# Patient Record
Sex: Female | Born: 1960
Health system: Southern US, Community
[De-identification: ages and names within clinical notes are randomized; demographics above are authoritative.]

## PROBLEM LIST (undated history)

## (undated) DIAGNOSIS — J45909 Unspecified asthma, uncomplicated: Secondary | ICD-10-CM

## (undated) DIAGNOSIS — R51 Headache: Secondary | ICD-10-CM

## (undated) DIAGNOSIS — R7611 Nonspecific reaction to tuberculin skin test without active tuberculosis: Secondary | ICD-10-CM

## (undated) DIAGNOSIS — F419 Anxiety disorder, unspecified: Secondary | ICD-10-CM

## (undated) DIAGNOSIS — R011 Cardiac murmur, unspecified: Secondary | ICD-10-CM

## (undated) DIAGNOSIS — K589 Irritable bowel syndrome without diarrhea: Secondary | ICD-10-CM

## (undated) DIAGNOSIS — I10 Essential (primary) hypertension: Secondary | ICD-10-CM

## (undated) DIAGNOSIS — F32A Depression, unspecified: Secondary | ICD-10-CM

## (undated) DIAGNOSIS — J302 Other seasonal allergic rhinitis: Secondary | ICD-10-CM

## (undated) DIAGNOSIS — K219 Gastro-esophageal reflux disease without esophagitis: Secondary | ICD-10-CM

## (undated) DIAGNOSIS — F329 Major depressive disorder, single episode, unspecified: Secondary | ICD-10-CM

## (undated) DIAGNOSIS — M5030 Other cervical disc degeneration, unspecified cervical region: Secondary | ICD-10-CM

## (undated) DIAGNOSIS — M797 Fibromyalgia: Secondary | ICD-10-CM

## (undated) DIAGNOSIS — M810 Age-related osteoporosis without current pathological fracture: Secondary | ICD-10-CM

## (undated) DIAGNOSIS — G43909 Migraine, unspecified, not intractable, without status migrainosus: Secondary | ICD-10-CM

## (undated) DIAGNOSIS — K449 Diaphragmatic hernia without obstruction or gangrene: Secondary | ICD-10-CM

## (undated) HISTORY — DX: Other seasonal allergic rhinitis: J30.2

## (undated) HISTORY — PX: UPPER GASTROINTESTINAL ENDOSCOPY: SHX188

## (undated) HISTORY — DX: Age-related osteoporosis without current pathological fracture: M81.0

## (undated) HISTORY — DX: Diaphragmatic hernia without obstruction or gangrene: K44.9

## (undated) HISTORY — DX: Essential (primary) hypertension: I10

## (undated) HISTORY — DX: Depression, unspecified: F32.A

## (undated) HISTORY — DX: Major depressive disorder, single episode, unspecified: F32.9

## (undated) HISTORY — DX: Other cervical disc degeneration, unspecified cervical region: M50.30

## (undated) HISTORY — PX: CHOLECYSTECTOMY: SHX55

## (undated) HISTORY — DX: Nonspecific reaction to tuberculin skin test without active tuberculosis: R76.11

## (undated) HISTORY — DX: Anxiety disorder, unspecified: F41.9

## (undated) HISTORY — DX: Unspecified asthma, uncomplicated: J45.909

## (undated) HISTORY — DX: Migraine, unspecified, not intractable, without status migrainosus: G43.909

## (undated) HISTORY — DX: Gastro-esophageal reflux disease without esophagitis: K21.9

## (undated) HISTORY — DX: Fibromyalgia: M79.7

## (undated) HISTORY — PX: COLONOSCOPY: SHX174

## (undated) HISTORY — DX: Cardiac murmur, unspecified: R01.1

## (undated) HISTORY — DX: Headache: R51

## (undated) HISTORY — DX: Irritable bowel syndrome, unspecified: K58.9

---

## 1997-10-11 ENCOUNTER — Emergency Department (HOSPITAL_COMMUNITY): Admission: EM | Admit: 1997-10-11 | Discharge: 1997-10-11 | Payer: Self-pay | Admitting: Emergency Medicine

## 1999-02-12 DIAGNOSIS — R7611 Nonspecific reaction to tuberculin skin test without active tuberculosis: Secondary | ICD-10-CM

## 1999-02-12 HISTORY — DX: Nonspecific reaction to tuberculin skin test without active tuberculosis: R76.11

## 2003-09-19 ENCOUNTER — Inpatient Hospital Stay (HOSPITAL_COMMUNITY): Admission: RE | Admit: 2003-09-19 | Discharge: 2003-09-27 | Payer: Self-pay | Admitting: Psychiatry

## 2003-09-28 ENCOUNTER — Other Ambulatory Visit (HOSPITAL_COMMUNITY): Admission: RE | Admit: 2003-09-28 | Discharge: 2003-12-27 | Payer: Self-pay | Admitting: Psychiatry

## 2004-06-25 ENCOUNTER — Encounter: Admission: RE | Admit: 2004-06-25 | Discharge: 2004-06-25 | Payer: Self-pay | Admitting: Family Medicine

## 2004-07-11 ENCOUNTER — Other Ambulatory Visit: Admission: RE | Admit: 2004-07-11 | Discharge: 2004-07-11 | Payer: Self-pay | Admitting: Family Medicine

## 2006-05-06 ENCOUNTER — Encounter: Admission: RE | Admit: 2006-05-06 | Discharge: 2006-05-06 | Payer: Self-pay | Admitting: Family Medicine

## 2006-06-18 ENCOUNTER — Inpatient Hospital Stay (HOSPITAL_COMMUNITY): Admission: AD | Admit: 2006-06-18 | Discharge: 2006-06-23 | Payer: Self-pay | Admitting: Psychiatry

## 2006-06-18 ENCOUNTER — Ambulatory Visit: Payer: Self-pay | Admitting: Psychiatry

## 2006-06-24 ENCOUNTER — Other Ambulatory Visit (HOSPITAL_COMMUNITY): Admission: RE | Admit: 2006-06-24 | Discharge: 2006-07-11 | Payer: Self-pay | Admitting: Psychiatry

## 2008-10-01 ENCOUNTER — Inpatient Hospital Stay (HOSPITAL_COMMUNITY): Admission: EM | Admit: 2008-10-01 | Discharge: 2008-10-03 | Payer: Self-pay | Admitting: Family Medicine

## 2008-10-02 ENCOUNTER — Encounter (INDEPENDENT_AMBULATORY_CARE_PROVIDER_SITE_OTHER): Payer: Self-pay | Admitting: General Surgery

## 2009-08-07 ENCOUNTER — Ambulatory Visit: Payer: Self-pay | Admitting: Internal Medicine

## 2009-08-07 ENCOUNTER — Other Ambulatory Visit: Admission: RE | Admit: 2009-08-07 | Discharge: 2009-08-07 | Payer: Self-pay | Admitting: Internal Medicine

## 2009-10-09 ENCOUNTER — Ambulatory Visit: Payer: Self-pay | Admitting: Internal Medicine

## 2010-02-15 ENCOUNTER — Ambulatory Visit
Admission: RE | Admit: 2010-02-15 | Discharge: 2010-02-15 | Payer: Self-pay | Source: Home / Self Care | Attending: Internal Medicine | Admitting: Internal Medicine

## 2010-05-19 LAB — CBC
HCT: 36.5 % (ref 36.0–46.0)
Hemoglobin: 12.4 g/dL (ref 12.0–15.0)
MCHC: 33.6 g/dL (ref 30.0–36.0)
MCHC: 33.9 g/dL (ref 30.0–36.0)
MCV: 92.2 fL (ref 78.0–100.0)
Platelets: 180 10*3/uL (ref 150–400)
Platelets: 202 10*3/uL (ref 150–400)
RBC: 3.96 MIL/uL (ref 3.87–5.11)
RDW: 14.3 % (ref 11.5–15.5)
RDW: 15 % (ref 11.5–15.5)
WBC: 6.8 K/uL (ref 4.0–10.5)

## 2010-05-19 LAB — DIFFERENTIAL
Basophils Absolute: 0 K/uL (ref 0.0–0.1)
Basophils Relative: 0 % (ref 0–1)
Eosinophils Absolute: 0.1 K/uL (ref 0.0–0.7)
Eosinophils Relative: 1 % (ref 0–5)
Lymphocytes Relative: 15 % (ref 12–46)
Lymphs Abs: 1 10*3/uL (ref 0.7–4.0)
Monocytes Absolute: 0.6 10*3/uL (ref 0.1–1.0)
Monocytes Relative: 9 % (ref 3–12)
Neutro Abs: 5.1 K/uL (ref 1.7–7.7)
Neutrophils Relative %: 75 % (ref 43–77)

## 2010-05-19 LAB — COMPREHENSIVE METABOLIC PANEL
ALT: 255 U/L — ABNORMAL HIGH (ref 0–35)
AST: 271 U/L — ABNORMAL HIGH (ref 0–37)
Albumin: 2.8 g/dL — ABNORMAL LOW (ref 3.5–5.2)
Albumin: 3.4 g/dL — ABNORMAL LOW (ref 3.5–5.2)
Alkaline Phosphatase: 233 U/L — ABNORMAL HIGH (ref 39–117)
BUN: 3 mg/dL — ABNORMAL LOW (ref 6–23)
BUN: 8 mg/dL (ref 6–23)
Calcium: 7.6 mg/dL — ABNORMAL LOW (ref 8.4–10.5)
Calcium: 8.7 mg/dL (ref 8.4–10.5)
Chloride: 103 mEq/L (ref 96–112)
Chloride: 104 mEq/L (ref 96–112)
Creatinine, Ser: 0.71 mg/dL (ref 0.4–1.2)
Creatinine, Ser: 0.78 mg/dL (ref 0.4–1.2)
GFR calc Af Amer: >60 mL/min (ref >60)
GFR calc non Af Amer: >60 mL/min (ref >60)
Potassium: 3.1 mEq/L — ABNORMAL LOW (ref 3.5–5.1)
Potassium: 3.4 mEq/L — ABNORMAL LOW (ref 3.5–5.1)
Sodium: 138 mEq/L (ref 135–145)
Total Protein: 6.5 g/dL (ref 6.0–8.3)

## 2010-05-19 LAB — URINALYSIS, ROUTINE W REFLEX MICROSCOPIC
Glucose, UA: NEGATIVE mg/dL
Hgb urine dipstick: NEGATIVE
Nitrite: NEGATIVE
Protein, ur: NEGATIVE mg/dL
Specific Gravity, Urine: 1.023 (ref 1.005–1.030)
Urobilinogen, UA: 4 mg/dL — ABNORMAL HIGH (ref 0.0–1.0)
pH: 6 (ref 5.0–8.0)

## 2010-05-19 LAB — HEPATIC FUNCTION PANEL
AST: 100 U/L — ABNORMAL HIGH (ref 0–37)
Albumin: 2.6 g/dL — ABNORMAL LOW (ref 3.5–5.2)
Alkaline Phosphatase: 208 U/L — ABNORMAL HIGH (ref 39–117)
Bilirubin, Direct: 0.2 mg/dL (ref 0.0–0.3)
Indirect Bilirubin: 0.5 mg/dL (ref 0.3–0.9)

## 2010-05-19 LAB — PREGNANCY, URINE: Preg Test, Ur: NEGATIVE

## 2010-05-19 LAB — COMPREHENSIVE METABOLIC PANEL WITH GFR
AST: 477 U/L — ABNORMAL HIGH (ref 0–37)
Alkaline Phosphatase: 257 U/L — ABNORMAL HIGH (ref 39–117)
CO2: 28 meq/L (ref 19–32)
GFR calc non Af Amer: >60 mL/min (ref >60)
Glucose, Bld: 101 mg/dL — ABNORMAL HIGH (ref 70–99)
Total Bilirubin: 1.3 mg/dL — ABNORMAL HIGH (ref 0.3–1.2)

## 2010-05-19 LAB — PROTIME-INR
INR: 0.9 (ref 0.00–1.49)
Prothrombin Time: 12.5 seconds (ref 11.6–15.2)

## 2010-05-19 LAB — TYPE AND SCREEN: Antibody Screen: NEGATIVE

## 2010-05-19 LAB — HEPATITIS B SURFACE ANTIGEN: Hepatitis B Surface Ag: NEGATIVE

## 2010-06-26 NOTE — Op Note (Signed)
[name], [name]                ACCOUNT NO.:  [id number]   MEDICAL RECORD NO.:  [id number]          PATIENT TYPE:  INP   LOCATION:  1531                         FACILITY:  [facility]   PHYSICIAN:  Petra Kuba, M.D.    DATE OF BIRTH:  01/16/1961   DATE OF PROCEDURE:  10/01/2008  DATE OF DISCHARGE:                               OPERATIVE REPORT   PROCEDURE:  Endoscopic retrograde cholangiopancreatography,  sphincterotomy and stone extraction.   INDICATIONS:  Probable CBD stone in  a patient with gallstones, dilated  CBD, nausea, vomiting, abdominal pain, elevated liver tests.  Consent  was signed after risks, benefits, methods, options thoroughly discussed  by my partner, Dr. Matthias Hughs and me prior to procedure.   MEDICINES USED DURING THE PROCEDURE:  Fentanyl 50 mcg, Versed 2.5 mg,  glucagon for increased spasm throughout the procedure 2 mg.  She did  have pre procedure Ativan  2 mg and Phenergan 25 mg prior to the  procedure as well.   PROCEDURE:  The video therapeutic duodenoscope was inserted by indirect  vision into the stomach and advanced through her antrum and pylorus into  the duodenum and a normal-appearing ampulla was brought into view.  Using the triple-lumen sphincterotome deep selective cannulation was  obtained and the Jag wire was advanced into the intrahepatic.  No PD  injections or wire advancements were done.   On initial cholangiogram a small stone was seen.  The cystic duct was  patent.  There was some cystic duct filling.  We went ahead and  proceeded with a medium-sized sphincterotomy until we had adequate  biliary drainage and were able to get the fully bowed sphincterotome  easily in and out of the duct.  Unfortunately at that juncture using the  pre-spasm throughout the procedure  and we had to give a moderate amount  of glucagon just to finish the procedure.  We did not increase the  sphincterotomy any further once the spasm started.   We did advance the  balloon adjustable 12-15 mm over the wire after the  sphincterotome was removed and proceeded with three or four balloon pull-  throughs.  Unfortunately on the first balloon pull-through both the wire  and the balloon fell out of the duct and it was not clearly apparent  that the stone was delivered at that juncture due to spasm.  Once the  spasm decreased,  we were able to recannulate with the balloon catheter  and again the wire was advanced into the intrahepatic.   We went ahead and proceeded with a few more balloon pull-throughs  at  the 12 mm mark which passed fairly easily through the ampulla requiring  just downward deflection of the scope to withdrawal.  We then went ahead  and proceeded with an occlusion cholangiogram which was normal.  No  further stones were seen or delivered although at this juncture we did  see one in the duodenum which was small.  We went ahead and pulled the  balloon through one more time and again no abnormalities or obvious  residual stones were seen.  We elected to stop the procedure at this  junction.  The wire was removed as was the balloon catheter. Scope was  withdrawn.  The patient tolerated the procedure well.  There was no  obvious immediate complication.   ENDOSCOPIC DIAGNOSES:  1. Normal ampulla.  2. No PD injections or wire advancements.  3. One small common bile duct stone seen on initial injection status      post medium sphincterotomy and multiple 12-mm balloon pull-      throughs using the adjustable 12-15 mm balloon.  4. Negative occlusion cholangiogram at the end of the procedure.  5. Negative balloon pull-through at the end of the procedure was then      seen in the duodenum.   PLAN:  Will observe for delayed complications.  If none, laparoscopic  cholecystectomy hopefully tomorrow.  Will notify Dr. Johna Sheriff.           ______________________________  Petra Kuba, M.D.     MEM/MEDQ  D:  10/01/2008  T:  10/02/2008  Job:   [id number]   cc:   Marlane Mingle, M.D.  Fax: [phone]

## 2010-06-26 NOTE — Consult Note (Signed)
[name], [name]                ACCOUNT NO.:  [id number]   MEDICAL RECORD NO.:  [id number]          PATIENT TYPE:  INP   LOCATION:  1531                         FACILITY:  [facility]   PHYSICIAN:  Sharlet Salina T. Hoxworth, M.D.DATE OF BIRTH:  [birth year]   DATE OF CONSULTATION:  10/01/2008  DATE OF DISCHARGE:                                 CONSULTATION   CHIEF COMPLAINT:  Abdominal pain.   HISTORY:  The patient is a 50 year old female who presented to the  emergency room today with the acute onset of severe pressure-like upper  abdominal pain radiating to both costal margins associated with nausea  and no vomiting.  The patient has been having intermittent episodes of  pain gradually worsening actually for a couple of years.  She has been  previously diagnosed with a hiatal hernia.  Upon presentation to the  emergency room she was found to have evaluated LFTs and small gallstones  as detailed below.  Earlier today she underwent a successful ERCP and  stone extraction by Dr. Ewing Schlein.  She currently denies pain or nausea and  is doing well following this procedure.  She has had no fever, chills,  jaundice noted.   PAST MEDICAL HISTORY:  Previous surgeries include cesarean section times  two.  Medically, she is followed for depression and fibromyalgia.   MEDICATIONS:  Medications on admission are Cymbalta and Seroquel.   ALLERGIES:  No known drug allergies.   SOCIAL HISTORY:  She works as a Patent examiner.  She drinks one or  two drinks a day.  She does not smoke cigarettes.   FAMILY HISTORY:  Family history is noncontributory.   REVIEW OF SYSTEMS:  GENERAL:  Weight stable.  No fever or chills.  RESPIRATORY:  She states she has occasional wheezing and asthma for  which she uses an inhaler infrequently.  CARDIAC:  No history of cardiac disease, chest pain, palpitations,  swelling.  ABDOMEN/GASTROINTESTINAL:  As above.  GU:  No urinary burning, frequency, hematuria.  MUSCULOSKELETAL:   Positive for chronic joint pain.  PSYCHIATRIC:  Positive for history of depression, not active.   PHYSICAL EXAMINATION:  VITALS SIGNS:  She is afebrile.  Respirations are  18, heart rate 100, blood pressure 116/72.  GENERAL:  In general she is slightly drowsy following ERCP but  responsive in no acute distress.  SKIN:  Warm and dry.  No rash or infection.  HEENT:  No definite icterus.  Pupils are equal, round, and reactive.  Oropharynx is clear.  No masses.  LYMPH NODES:  No cervical, supraclavicular nodes palpable.  LUNGS:  Clear without wheezing or increased work of breathing.  CARDIAC:  Regular rate and rhythm.  No murmurs.  No edema.  ABDOMEN:  Soft and nontender.  No discernible masses or organomegaly.  EXTREMITIES:  No swelling, deformity.  NEUROLOGIC:  Alert and oriented.  Motor and sensory examination grossly  normal.   LABORATORY:  White count is 6.8, hemoglobin 12.4.  Electrolytes abnormal  only for a potassium of 3.4.  INR is 0.9.  LFTs prior to ERCP were a  bilirubin of 1.3,  alkaline phosphatase 257, transaminases 477 and 255  respectively.  Lipase was 21.  Urinalysis negative.  Hepatitis surface  antigen negative.  Pregnancy test negative.   IMAGING:  CT scan of the abdomen obtained on admission showed  cholelithiasis and mildly dilated bile ducts.  Ultrasound of the abdomen  confirmed these findings.  ERCP as above.   ASSESSMENT/PLAN:  A 50 year old female with episode of biliary colic and  choledocholithiasis status post ERCP and stone extraction.  I have  recommended proceeding with laparoscopic cholecystectomy tomorrow to  prevent further complications from her gallstones.  The nature of the  procedure, its indications, risks of anesthetic risks, bleeding,  infection, bile leak, bile duct injury were discussed with the patient  and her family.  Questions were answered.  We will plan to proceed with  laparoscopic cholecystectomy tomorrow.      Lorne Skeens.  Hoxworth, M.D.  Electronically Signed     BTH/MEDQ  D:  10/01/2008  T:  10/02/2008  Job:  [id number]

## 2010-06-26 NOTE — H&P (Signed)
NAME:  [name], [name] NO.:  [id number]   MEDICAL RECORD NO.:  [id number]          PATIENT TYPE:  EMS   LOCATION:  ED                           FACILITY:  [facility]   PHYSICIAN:  Bernette Redbird, M.D.   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   The patient developed significant mid abdominal and upper abdominal pain  radiating through to her back about 3:00 p.m. yesterday, several hours  after eating an egg and biscuit breakfast.  There was no problem with  fevers, chills, vomiting or diarrhea, but she did have some intermittent  nausea.  The pain was fairly steady in character and fairly severe.  She  presented to the emergency room, where she was noted to have elevated  liver chemistries, prompting a CT scan which showed a common bile duct  stone to be present.  An abdominal ultrasound was also obtained which  showed gallstones and an 8 mm common bile duct.  I was contacted by the  ER staff and felt that the patient should have inpatient care for these  problems and findings.   In the meantime, the patient has received Dilaudid with good relief of  her pain.   PAST MEDICAL HISTORY:  No known allergies.   OUTPATIENT MEDICATIONS:  1. Cymbalta 120 mg nightly.  2. Seroquel 50 mg nightly.   OPERATIONS:  C-section x3.   CHRONIC MEDICAL ILLNESSES:  1. History of depression and fibromyalgia for which she is on the      above medications.  2. Also, she has a history of a hiatal hernia.  3. Intermittent IBS which is constipation, predominant.  4. A history of anemia in the past attributed to heavy menstrual      cycles.  5. She has not had screening colonoscopy.   HABITS:  Nonsmoker, moderate ethanol (two glasses of  wine daily).   FAMILY HISTORY:  Details are sketchy since the patient is adopted, but  her mother did have type 1 diabetes.  There is no known family history  of biliary tract disease nor of colon cancer.   SOCIAL HISTORY:  The patient is divorced and lives with two of her three  children.  She works as a Patent examiner.   REVIEW OF SYSTEMS:  This is negative for any significant ongoing  symptoms, in particular, no cardiopulmonary symptoms and no obvious  prior episodes of biliary colic.  At baseline, she does not have  significant upper tract symptoms such as dysphagia, reflux, abdominal  pain, nausea, vomiting nor lower tract symptoms such as constipation,  diarrhea or rectal bleeding, except for some bowel habit irregularity on  an occasional basis as noted above.   PHYSICAL EXAM:  The patient, I believe, is anicteric.  Heart rate is  approximately 80.  She had is not toxic in appearance.  She is anicteric  and without pallor.  CHEST:  Is clear to auscultation.  HEART:  Normal, without murmurs or arrhythmias.  ABDOMEN:  Remarkably benign.  Soft.  Bowel sounds are present status  post Dilaudid therapy and no organomegaly, guarding, mass effect, or  significant tenderness is present.  There is perhaps some mild  subjective upper abdominal tenderness, certainly no peritoneal findings.   LABORATORIES:  White count 6800 with 75 polys, 15 lymphs, 9 monocytes.  Hemoglobin 12.4, platelets 202,000.  Chemistry panel pertinent for  normal renal function, bilirubin 1.3, alk phos 257, AST 477, ALT 255,  albumin 3.4, lipase normal at 21. Pregnancy test negative.  Urinalysis  clear except for bilirubin.  CT scan shows a dilated roughly 9 mm bile duct without intrahepatic  biliary ductal dilatation.  No pancreatic or hepatic abnormalities.  Gallstones noted in the gallbladder.  No ascites present.  Ultrasound  showed multiple small stones but no gallbladder wall thickening and a  roughly  8-mm common bile duct.   IMPRESSION:  Symptomatic choledocholithiasis.  Even though a stone has  not been discretely observed within the duct, its presence can be  inferred by the acute onset of pain and the significant elevation of  liver chemistries.  There is no evidence of gallstone pancreatitis and  no evidence of septic cholangitis as possible complicating features.  The observed pattern of liver chemistry elevation could conceivably be  found as consequence of alcoholic hepatitis if indeed the patient were a  heavier drinker then she admits to, but that would not account for the  acute pain and of course the fact that she has multiple small stones  places her at risk for choledocholithiasis.   PLAN:  1. Proceed to endoscopic retrograde cholangiopancreatography and      probable sphincterotomy and stone extraction today.  I have      carefully discussed the nature, purpose, risks, and alternatives of      this procedure with the patient, including rare mortality of about      02/998, possible need for emergent surgery, roughly 3% risk of      pancreatitis, and a perhaps 5% chance of inability to get the stone      out if one is present.  2. I have contacted Dr. Johna Sheriff of Surgery to see the patient at his      convenience for consideration of cholecystectomy, depending on how      she does with the endoscopic retrograde cholangiopancreatography.  3. The patient is a candidate for screening colonoscopy but that would      of course be deferred until the acute problems are addressed.           ______________________________  Bernette Redbird, M.D.     RB/MEDQ  D:  10/01/2008  T:  10/02/2008  Job:  [id number]   cc:   Sharlet Salina T. Hoxworth, M.D.  [address]  [place]  [place] [address]

## 2010-06-26 NOTE — Discharge Summary (Signed)
[name], [name]                ACCOUNT NO.:  [id number]   MEDICAL RECORD NO.:  [id number]          PATIENT TYPE:  INP   LOCATION:  1531                         FACILITY:  [facility]   PHYSICIAN:  Bernette Redbird, M.D.   DATE OF BIRTH:  03/31/1960   DATE OF ADMISSION:  10/01/2008  DATE OF DISCHARGE:  10/03/2008                               DISCHARGE SUMMARY   FINAL DIAGNOSES:  1. Choledocholithiasis, symptomatic.  2. Elevated liver chemistries secondary to #1.  3. Cholelithiasis.  4. History of depression and fibromyalgia.  5. History of intermittent irritable bowel syndrome, constipation      predominant.  6. Past history of anemia, possibly related to menorrhagia.   CONSULTATIONS:  Dr. Glenna Fellows, general surgery.   PROCEDURES:  ERCP with sphincterotomy and stone extraction by Dr. Ewing Schlein.  Laparoscopic cholecystectomy by Dr. Johna Sheriff.  CT of abdomen and pelvis.  Abdominal ultrasound.   LABORATORY DATA:  Admission white count 6800, hemoglobin 12.4 which fell  to 10.8 following overnight hydration, platelets 202,000.  Differential  count unremarkable with 75 polys, 15 lymphs and 9 monocytes.  INR normal  at 0.9 at admission BUN 8, with creatinine of 0.8.  Admission bilirubin  1.3, alk phos 257, AST 477, ALT 255.  On day of discharge, bilirubin was  0.7, alk phos 208, AST 100, ALT 228.  Post hydration, albumin 2.6.  Lipase on admission was normal at 21.  Urine pregnancy test was  negative.  Hepatitis B surface antigen was negative.   HOSPITAL COURSE:  The patient was admitted through the emergency room  following a roughly 24-hour prodrome of abdominal pain associated with  elevated liver chemistries and radiographic findings of a dilated bile  duct suggesting symptomatic cholelithiasis without complication of  cholangitis or pancreatitis.  On the afternoon of admission, she  underwent ERCP with sphincterotomy and stone extraction with a small  stone or 2 being pulled out.   She then went to laparoscopic  cholecystectomy the next morning, which was uneventful and the  intraoperative cholangiogram was clear.  The patient's diet was advanced  and her liver chemistries were improved and it was thus felt appropriate  for her to be discharged on the first postoperative day.   DISPOSITION:  The patient is discharged home without any specific  dietary restrictions and with resumption of her previous home  medications, specifically Cymbalta 120 mg daily and Seroquel 100 mg at  bedtime.  She will follow up with Dr. Johna Sheriff in his office in  approximately 2 weeks and I have left him a voice mail requesting that  he recheck liver chemistries and refer her to her primary  gastroenterologist, Dr. Carman Ching, if they do not normalize.   DISCHARGE MEDICATIONS:  See above.   CONDITION ON DISCHARGE:  Improved.           ______________________________  Bernette Redbird, M.D.     RB/MEDQ  D:  10/03/2008  T:  10/03/2008  Job:  [id number]   cc:   Lorne Skeens. Hoxworth, M.D.  [address], [address]  [place]  [place] [address]  James L. Malon Kindle., M.D.  Fax: [phone]

## 2010-06-26 NOTE — Op Note (Signed)
[name], [name]                ACCOUNT NO.:  [id number]   MEDICAL RECORD NO.:  [id number]          PATIENT TYPE:  INP   LOCATION:  1531                         FACILITY:  [facility]   PHYSICIAN:  Sharlet Salina T. Hoxworth, M.D.DATE OF BIRTH:  [birth year]   DATE OF PROCEDURE:  10/02/2008  DATE OF DISCHARGE:                               OPERATIVE REPORT   PREOPERATIVE DIAGNOSIS:  Cholelithiasis, status post endoscopic  retrograde cholangiopancreatography for choledocholithiasis.   POSTOPERATIVE DIAGNOSES:  Cholelithiasis, status post endoscopic  retrograde cholangiopancreatography for choledocholithiasis.   SURGICAL PROCEDURES:  Laparoscopic cholecystectomy with intraoperative  cholangiogram.   SURGEON:  Glenna Fellows, M.D.   ANESTHESIA:  General.   BRIEF HISTORY:  [name] is a 48-year female with repeated episodes  of epigastric abdominal pain.  She presented yesterday with a severe  episode and had elevated LFTs.  Ultrasound showed gallstones and a  mildly dilated common bile duct.  She is status post ERCP and common  bile duct stone extraction by Dr. Ewing Schlein yesterday.  We have recommended  proceeding with laparoscopic cholecystectomy.   The nature of the procedure, its indications, risks of bleeding,  infection, bile leak, bile duct injury, and anesthetic complications  were discussed and understood.  Patient was brought to the operating  room for this procedure.   DESCRIPTION OF OPERATION:  Patient brought to the operating room, placed  in supine position on the operating table and general endotracheal  anesthesia was induced.  The abdomen was widely sterilely prepped and  draped.  Correct patient and procedure were verified.  She had received  preoperative IV antibiotics and PAS were in place.  Local anesthesia was  used to infiltrate the trocar sites.  Access was obtained with a 1-cm  open Hasson technique at the umbilicus and pneumoperitoneum established.  An 11-mm  trocar was placed subxiphoid and two 5-mm trocars on the right  subcostal margin under direct vision.   The gallbladder was exposed and was somewhat tense and edematous.  The  fundus was grasped and elevated up over the liver.  There were some  filmy attachments of the duodenum to the infundibulum which were taken  down with careful sharp dissection and the infundibulum retracted  inferolaterally.  Fibrofatty tissue was stripped off the neck of the  gallbladder toward the porta hepatis and peritoneum anterior and  posterior to Calot's triangle was incised.  The distal gallbladder was  thoroughly dissected and the cystic duct and cystic artery identified  and the cystic duct-gallbladder junction dissected 360 degrees.  When  the anatomy appeared clear, the cystic artery was clipped, the cystic  duct was clipped at the gallbladder junction and operative cholangiogram  obtained through the cystic duct.  This showed good filling of a mildly  dilated common bile duct and intrahepatic ducts.  There was filling into  the duodenum with no obstruction or filling defect.   Following this, the cholangiocath was removed and the cystic duct was  triply clipped proximally and divided.  The cystic artery was further  clipped and divided.  The gallbladder was then dissected free from its  bed using hook cautery and removed through the umbilicus.  The right  upper quadrant was irrigated and complete hemostasis assured.  A  Surgicel pack was left in the gallbladder fossa.  There was no evidence  of trocar injury.  Trocars were removed and all CO2 evacuated.  The  mattress suture was secured at the umbilicus.  Skin incisions were  closed with subcuticular Monocryl and Dermabond.  Sponge, needle and  instrument counts were correct.  The patient was taken to recovery in  good condition.      [name]. Hoxworth, M.D.  Electronically Signed     BTH/MEDQ  D:  10/02/2008  T:  10/03/2008  Job:   [id number]

## 2010-06-26 NOTE — Discharge Summary (Signed)
NAMECYNCERE, [name]                ACCOUNT NO.:  [id number]   MEDICAL RECORD NO.:  [id number]          PATIENT TYPE:  IPS   LOCATION:  0306                          FACILITY:  [facility]   PHYSICIAN:  Anselm Jungling, MD  DATE OF BIRTH:  03/31/1960   DATE OF ADMISSION:  06/18/2006  DATE OF DISCHARGE:  06/23/2006                               DISCHARGE SUMMARY   IDENTIFYING DATA AND REASON FOR ADMISSION:  This was an inpatient  psychiatric admission for [name], a 50 year old female with 3 teenaged  children, who requested treatment due to increasing depression and  suicidal ideation.  She came to [place] with a history of recurrent major  depression.  Her appetite had been decreased, she had had sleep  disturbance, had been crying frequently, and low energy.  She came to [place]  on a regimen of Cymbalta, Wellbutrin, Lunesta and Neurontin.  She  admitted to some alcohol abuse, her last alcohol intake having been 2  days prior to admission, and only a couple of drinks.  She reported  that she typically drank 2-3 drinks per day.  Please refer to the  admission note for further details pertaining to the symptoms,  circumstances and history that led to her hospitalization.  She was  given initial Axis I diagnoses of major depressive disorder, recurrent,  and rule out alcohol abuse.   MEDICAL AND LABORATORY:  The patient was medically and physically  assessed by the psychiatric nurse practitioner.  She came to [place] with a  history of irritable bowel syndrome, fibromyalgia, and gluten  intolerance.  She was placed on a gluten-free diet.  She was continued  on her usual Prevacid, vitamin D supplement, and Neurontin.  She was  also continued on ferrous sulfate 325 mg t.i.d., folic acid 1 mg daily,  and a multivitamin daily.  There were no acute medical issues.   HOSPITAL COURSE:  The patient was admitted to the adult inpatient  psychiatric service.  She presented as a well-nourished, well-developed  woman of  above-average education and intelligence.  She was alert, fully  oriented, and very pleasant, but sad, with depressed mood.  She was a  good historian.  She described her recent stressors in detail.  There  were no signs or symptoms of psychosis or thought disorder.  In the  initial interview, she denied any active suicidal ideation, and remained  free of suicidal ideation throughout her inpatient stay.  She verbalized  a strong desire for help.   The patient was continued on a psychotropic regimen of Cymbalta 60 mg  daily, and gave the history that in the past she had done well on Paxil.  She agreed to a retrial of Paxil 20 mg daily.  It was felt best to  continue Cymbalta for now, with plans for tapering to discontinuation  after Paxil had had time to become effective for her.  Meanwhile, she  was continued on Wellbutrin and Neurontin, which had in part been  prescribed due to fibromyalgia symptoms.   The patient participated in various therapeutic groups and activities,  and she was  a good participant in the program, although she stated that  she did not find groups particularly helpful to her.   Early on we discussed the possibility of her eventually stepping down to  our intensive outpatient program, in which the patient expressed  interest.   We discussed the possibility of a family counseling session during her  stay, but the patient did not feel that this was something that was  necessary or that she wanted.   By the sixth hospital day, the patient had been absent suicidal ideation  during her entire stay.  She appeared to be tolerating her medication  changes, and her mood was somewhat improved.  She was more optimistic  and felt ready to be discharged, with continuation of treatment in the  intensive outpatient program.   AFTERCARE:  The patient was to follow up in the intensive outpatient  program on the day of discharge, Jun 23, 2006.   DISCHARGE MEDICATIONS:  1.  Prevacid 30 mg daily.  2. Wellbutrin SR 150 mg daily.  3. Vitamin D capsule 50,000 units every Thursday.  4. Cymbalta 60 mg daily.  5. Paxil 20 mg daily for a week, then increase to 40 mg daily.  6. Colace 100 mg q.a.m. and 200 mg q.h.s.  7. Neurontin 300 mg b.i.d. and 600 mg q.h.s.  8. Ferrous sulfate 325 mg t.i.d.  9. Folic acid 1 mg daily.  10.Multivitamin one tablet daily.   DISCHARGE DIAGNOSES:  AXIS I: Major depressive disorder, recurrent.  AXIS II: Deferred.  AXIS III: History of fibromyalgia, irritable bowel syndrome, gluten  intolerance.  AXIS IV: Stressors severe.  AXIS V: GAF on discharge 65.   It should be noted that during the course of her stay, it was determined  that the patient did not really show evidence of an ongoing problem with  alcohol abuse or dependence.  Nonetheless, she was advised to use  alcohol only very moderately, due to its potential negative effect on  her underlying mood disorder, should her drinking become more than very  occasional.      Anselm Jungling, MD  Electronically Signed     SPB/MEDQ  D:  06/24/2006  T:  06/24/2006  Job:  [id number]

## 2010-06-29 NOTE — Discharge Summary (Signed)
NAMEMarland Oneill  SELETHA, ZIMMERMANN                            ACCOUNT NO.:  [id number]   MEDICAL RECORD NO.:  [id number]                   PATIENT TYPE:  IPS   LOCATION:  0501                                 FACILITY:  [facility]   PHYSICIAN:  Geoffery Lyons, M.D.                   DATE OF BIRTH:  1960/09/13   DATE OF ADMISSION:  09/19/2003  DATE OF DISCHARGE:  09/27/2003                                 DISCHARGE SUMMARY   CHIEF COMPLAINT AND PRESENT ILLNESS:  This was the first admission to [facility] for this 50 year old divorced white female  voluntarily admitted.  He had a history of depression for years.  He had  been able to cope for some time through counseling.  She felt that she  needed to be more assertive from controlling family.  She experienced stress  at work, feeling overwhelmed.  She had decreased concentration, suicidal  ideas to overdose.  She had trouble staying asleep.   PAST PSYCHIATRIC HISTORY:  This was the first time at [facility].  She was seeing a Veterinary surgeon.  Postpartum depression, [facility] 15 years ago.  No current treatment.   SUBSTANCE ABUSE HISTORY:  She denied the use or abuse of any substances.   PAST MEDICAL HISTORY:  1.  Fibromyalgia.  2.  Anemia.   MEDICATIONS:  1.  Wellbutrin XL 300 mg for the last six months.  2.  Ambien 10 mg at bedtime for sleep.  3.  Zoloft 200 mg daily, discontinued over the weekend.  4.  Switched to Cymbalta 60 mg per day for the last four days.   PHYSICAL EXAMINATION:  Physical examination was performed, failed to show  any acute findings.   LABORATORY DATA:  CBC was within normal limits.  Blood chemistries were  within normal limits.  Liver profile was within normal limits.  TSH 1.0106.  B12 449, folic acid 585.  Drug screen: Negative for substances of abuse.   MENTAL STATUS EXAM:  Mental status exam revealed an alert, cooperative  female, good eye contact.  Speech was clear.  Normal  rate, production, and  tempo.  Mood: Depressed.  Affect: Depressed.  Thought processes: Logical,  coherent, and relevant; dealt with the events that led to the  hospitalization, the stressors, suicidal ruminations, could contract for  safety, no delusions, no homicidal ideation, no hallucinations.  Cognitive:  Cognition was well preserved.   ADMISSION DIAGNOSES:   AXIS I:  Major depression, recurrent.   AXIS II:  No diagnosis.   AXIS III:  1.  Fibromyalgia.  2.  Anemia.   AXIS IV:  Moderate.   AXIS V:  Global assessment of functioning upon admission 25-30, highest  global assessment of functioning in the last year 65.   HOSPITAL COURSE:  She was admitted and started in intensive individual and  group psychotherapy.  She was  given Wellbutrin XL 150 mg in the morning,  Cymbalta 60 mg per day, Ambien 10 mg at bedtime for sleep, Seroquel 25 mg  every eight hours as needed, and Ativan.  She was given Motrin 800 mg three  times a day as needed for pain.  Cymbalta was later increased to 80 mg per  day.  She endorsed increased signs and symptoms of depression.  Trigger was  the conflict with the mother and some other family __________ issues.  She  claimed she was working on these issues.  She endorsed stress at work, felt  the depression was rendering her incapacitated, still trying to recover.  She was up to Wellbutrin 300 mg and Zoloft 200 mg with little benefit.  She  was switched to Cymbalta.  She was a Engineer, civil (consulting).  She was going to request a  change in her assignment.  She started opening up.  She was still a little  reserved, somewhat guarded, pretty flat affect.  She endorsed a long history  of conflict, codependency with the family, hard time asserting herself,  trying to decide what to do.  She continued to deal with her issues, worked  on Counsellor.  Gradually, her mood improved.  We  were trying to get a family session going but she would not agree to  get the  session in place.  She did not communicate with her mother.  She felt that  was the best thing to do.  There was some underlying sadness, some anxiety  anticipating when she was to leave the hospital.  By August 16 she was  better; no suicidal ideas, no homicidal ideas, no hallucinations, no  delusions.  She was willing to continue in outpatient treatment.  She was  going to come to IOP to further work on herself.  Upon discharge, she was  denying any suicidal or homicidal ideation, willingness to continue  outpatient followup.   DISCHARGE DIAGNOSES:   AXIS I:  Major depression, recurrent.   AXIS II:  No diagnosis.   AXIS III:  1.  Fibromyalgia.  2.  Anemia.   AXIS IV:  Moderate.   AXIS V:  Global assessment of functioning upon discharge 55-60.   DISCHARGE MEDICATIONS:  1.  Wellbutrin XL 150 mg per day.  2.  Cymbalta 80 mg per day.  3.  Seroquel 50 mg at night.  4.  Colace 100 mg twice a day.  5.  Ambien 10 mg at bedtime for sleep.  6.  Ativan 0.5 mg every six hours as needed for anxiety.   FOLLOW UP:  She was to follow up with [facility]  IOP.                                               Geoffery Lyons, M.D.    IL/MEDQ  D:  10/18/2003  T:  10/19/2003  Job:  [id number]

## 2011-01-28 ENCOUNTER — Ambulatory Visit (INDEPENDENT_AMBULATORY_CARE_PROVIDER_SITE_OTHER): Payer: BC Managed Care – PPO

## 2011-01-28 DIAGNOSIS — J209 Acute bronchitis, unspecified: Secondary | ICD-10-CM

## 2011-03-15 ENCOUNTER — Ambulatory Visit (INDEPENDENT_AMBULATORY_CARE_PROVIDER_SITE_OTHER): Payer: BC Managed Care – PPO | Admitting: Family Medicine

## 2011-03-15 VITALS — BP 152/107 | HR 105 | Temp 99.3°F | Resp 18 | Ht 61.5 in | Wt 138.0 lb

## 2011-03-15 DIAGNOSIS — B370 Candidal stomatitis: Secondary | ICD-10-CM

## 2011-03-15 DIAGNOSIS — J45909 Unspecified asthma, uncomplicated: Secondary | ICD-10-CM

## 2011-03-15 DIAGNOSIS — I10 Essential (primary) hypertension: Secondary | ICD-10-CM | POA: Insufficient documentation

## 2011-03-15 DIAGNOSIS — J40 Bronchitis, not specified as acute or chronic: Secondary | ICD-10-CM

## 2011-03-15 DIAGNOSIS — M797 Fibromyalgia: Secondary | ICD-10-CM | POA: Insufficient documentation

## 2011-03-15 MED ORDER — AMLODIPINE BESYLATE 5 MG PO TABS: 5.0000 mg | ORAL_TABLET | Freq: Every day | ORAL | Status: DC

## 2011-03-15 MED ORDER — AZITHROMYCIN 250 MG PO TABS: ORAL_TABLET | ORAL | Status: AC

## 2011-03-15 MED ORDER — MOMETASONE FURO-FORMOTEROL FUM 100-5 MCG/ACT IN AERO: 2.0000 | INHALATION_SPRAY | Freq: Two times a day (BID) | RESPIRATORY_TRACT | Status: DC

## 2011-03-15 MED ORDER — CYCLOBENZAPRINE HCL 10 MG PO TABS: 10.0000 mg | ORAL_TABLET | Freq: Three times a day (TID) | ORAL | Status: DC | PRN

## 2011-03-15 MED ORDER — FLUCONAZOLE 150 MG PO TABS: 150.0000 mg | ORAL_TABLET | Freq: Once | ORAL | Status: AC

## 2011-03-15 NOTE — Patient Instructions (Signed)
Bronchitis Bronchitis is the body's way of reacting to injury and/or infection (inflammation) of the bronchi. Bronchi are the air tubes that extend from the windpipe into the lungs. If the inflammation becomes severe, it may cause shortness of breath. CAUSES  Inflammation may be caused by:  A virus.   Germs (bacteria).   Dust.   Allergens.   Pollutants and many other irritants.  The cells lining the bronchial tree are covered with tiny hairs (cilia). These constantly beat upward, away from the lungs, toward the mouth. This keeps the lungs free of pollutants. When these cells become too irritated and are unable to do their job, mucus begins to develop. This causes the characteristic cough of bronchitis. The cough clears the lungs when the cilia are unable to do their job. Without either of these protective mechanisms, the mucus would settle in the lungs. Then you would develop pneumonia. Smoking is a common cause of bronchitis and can contribute to pneumonia. Stopping this habit is the single most important thing you can do to help yourself. TREATMENT   Your caregiver may prescribe an antibiotic if the cough is caused by bacteria. Also, medicines that open up your airways make it easier to breathe. Your caregiver may also recommend or prescribe an expectorant. It will loosen the mucus to be coughed up. Only take over-the-counter or prescription medicines for pain, discomfort, or fever as directed by your caregiver.   Removing whatever causes the problem (smoking, for example) is critical to preventing the problem from getting worse.   Cough suppressants may be prescribed for relief of cough symptoms.   Inhaled medicines may be prescribed to help with symptoms now and to help prevent problems from returning.   For those with recurrent (chronic) bronchitis, there may be a need for steroid medicines.  SEEK IMMEDIATE MEDICAL CARE IF:   During treatment, you develop more pus-like mucus  (purulent sputum).   You have a fever.   Your baby is older than 3 months with a rectal temperature of 102 F (38.9 C) or higher.   Your baby is 3 months old or younger with a rectal temperature of 100.4 F (38 C) or higher.   You become progressively more ill.   You have increased difficulty breathing, wheezing, or shortness of breath.  It is necessary to seek immediate medical care if you are elderly or sick from any other disease. MAKE SURE YOU:   Understand these instructions.   Will watch your condition.   Will get help right away if you are not doing well or get worse.  Document Released: 01/28/2005 Document Revised: 10/10/2010 Document Reviewed: 12/08/2007 ExitCare Patient Information 2012 [employer].  Smoking Cessation This document explains the best ways for you to quit smoking and new treatments to help. It lists new medicines that can double or triple your chances of quitting and quitting for good. It also considers ways to avoid relapses and concerns you may have about quitting, including weight gain. NICOTINE: A POWERFUL ADDICTION If you have tried to quit smoking, you know how hard it can be. It is hard because nicotine is a very addictive drug. For some people, it can be as addictive as heroin or cocaine. Usually, people make 2 or 3 tries, or more, before finally being able to quit. Each time you try to quit, you can learn about what helps and what hurts. Quitting takes hard work and a lot of effort, but you can quit smoking. QUITTING SMOKING IS ONE OF   THE MOST IMPORTANT THINGS YOU WILL EVER DO.  You will live longer, feel better, and live better.   The impact on your body of quitting smoking is felt almost immediately:   Within 20 minutes, blood pressure decreases. Pulse returns to its normal level.   After 8 hours, carbon monoxide levels in the blood return to normal. Oxygen level increases.   After 24 hours, chance of heart attack starts to decrease.  Breath, hair, and body stop smelling like smoke.   After 48 hours, damaged nerve endings begin to recover. Sense of taste and smell improve.   After 72 hours, the body is virtually free of nicotine. Bronchial tubes relax and breathing becomes easier.   After 2 to 12 weeks, lungs can hold more air. Exercise becomes easier and circulation improves.   Quitting will reduce your risk of having a heart attack, stroke, cancer, or lung disease:   After 1 year, the risk of coronary heart disease is cut in half.   After 5 years, the risk of stroke falls to the same as a nonsmoker.   After 10 years, the risk of lung cancer is cut in half and the risk of other cancers decreases significantly.   After 15 years, the risk of coronary heart disease drops, usually to the level of a nonsmoker.   If you are pregnant, quitting smoking will improve your chances of having a healthy baby.   The people you live with, especially your children, will be healthier.   You will have extra money to spend on things other than cigarettes.  FIVE KEYS TO QUITTING Studies have shown that these 5 steps will help you quit smoking and quit for good. You have the best chances of quitting if you use them together: 1. Get ready.  2. Get support and encouragement.  3. Learn new skills and behaviors.  4. Get medicine to reduce your nicotine addiction and use it correctly.  5. Be prepared for relapse or difficult situations. Be determined to continue trying to quit, even if you do not succeed at first.  1. GET READY  Set a quit date.   Change your environment.   Get rid of ALL cigarettes, ashtrays, matches, and lighters in your home, car, and place of work.   Do not let people smoke in your home.   Review your past attempts to quit. Think about what worked and what did not.   Once you quit, do not smoke. NOT EVEN A PUFF!  2. GET SUPPORT AND ENCOURAGEMENT Studies have shown that you have a better chance of being  successful if you have help. You can get support in many ways.  Tell your family, friends, and coworkers that you are going to quit and need their support. Ask them not to smoke around you.   Talk to your caregivers (doctor, dentist, nurse, pharmacist, psychologist, and/or smoking counselor).   Get individual, group, or telephone counseling and support. The more counseling you have, the better your chances are of quitting. Programs are available at local hospitals and health centers. Call your local health department for information about programs in your area.   Spiritual beliefs and practices may help some smokers quit.   Quit meters are small computer programs online or downloadable that keep track of quit statistics, such as amount of "quit-time," cigarettes not smoked, and money saved.   Many smokers find one or more of the many self-help books available useful in helping them quit and stay off   tobacco.  3. LEARN NEW SKILLS AND BEHAVIORS  Try to distract yourself from urges to smoke. Talk to someone, go for a walk, or occupy your time with a task.   When you first try to quit, change your routine. Take a different route to work. Drink tea instead of coffee. Eat breakfast in a different place.   Do something to reduce your stress. Take a hot bath, exercise, or read a book.   Plan something enjoyable to do every day. Reward yourself for not smoking.   Explore interactive web-based programs that specialize in helping you quit.  4. GET MEDICINE AND USE IT CORRECTLY Medicines can help you stop smoking and decrease the urge to smoke. Combining medicine with the above behavioral methods and support can quadruple your chances of successfully quitting smoking. The [place]. Food and Drug Administration (FDA) has approved 7 medicines to help you quit smoking. These medicines fall into 3 categories.  Nicotine replacement therapy (delivers nicotine to your body without the negative effects and risks  of smoking):   Nicotine gum: Available over-the-counter.   Nicotine lozenges: Available over-the-counter.   Nicotine inhaler: Available by prescription.   Nicotine nasal spray: Available by prescription.   Nicotine skin patches (transdermal): Available by prescription and over-the-counter.   Antidepressant medicine (helps people abstain from smoking, but how this works is unknown):   Bupropion sustained-release (SR) tablets: Available by prescription.   Nicotinic receptor partial agonist (simulates the effect of nicotine in your brain):   Varenicline tartrate tablets: Available by prescription.   Ask your caregiver for advice about which medicines to use and how to use them. Carefully read the information on the package.   Everyone who is trying to quit may benefit from using a medicine. If you are pregnant or trying to become pregnant, nursing an infant, you are under age 18, or you smoke fewer than 10 cigarettes per day, talk to your caregiver before taking any nicotine replacement medicines.   You should stop using a nicotine replacement product and call your caregiver if you experience nausea, dizziness, weakness, vomiting, fast or irregular heartbeat, mouth problems with the lozenge or gum, or redness or swelling of the skin around the patch that does not go away.   Do not use any other product containing nicotine while using a nicotine replacement product.   Talk to your caregiver before using these products if you have diabetes, heart disease, asthma, stomach ulcers, you had a recent heart attack, you have high blood pressure that is not controlled with medicine, a history of irregular heartbeat, or you have been prescribed medicine to help you quit smoking.  5. BE PREPARED FOR RELAPSE OR DIFFICULT SITUATIONS  Most relapses occur within the first 3 months after quitting. Do not be discouraged if you start smoking again. Remember, most people try several times before they finally  quit.   You may have symptoms of withdrawal because your body is used to nicotine. You may crave cigarettes, be irritable, feel very hungry, cough often, get headaches, or have difficulty concentrating.   The withdrawal symptoms are only temporary. They are strongest when you first quit, but they will go away within 10 to 14 days.  Here are some difficult situations to watch for:  Alcohol. Avoid drinking alcohol. Drinking lowers your chances of successfully quitting.   Caffeine. Try to reduce the amount of caffeine you consume. It also lowers your chances of successfully quitting.   Other smokers. Being around smoking can make you   want to smoke. Avoid smokers.   Weight gain. Many smokers will gain weight when they quit, usually less than 10 pounds. Eat a healthy diet and stay active. Do not let weight gain distract you from your main goal, quitting smoking. Some medicines that help you quit smoking may also help delay weight gain. You can always lose the weight gained after you quit.   Bad mood or depression. There are a lot of ways to improve your mood other than smoking.  If you are having problems with any of these situations, talk to your caregiver. SPECIAL SITUATIONS AND CONDITIONS Studies suggest that everyone can quit smoking. Your situation or condition can give you a special reason to quit.  Pregnant women/new mothers: By quitting, you protect your baby's health and your own.   Hospitalized patients: By quitting, you reduce health problems and help healing.   Heart attack patients: By quitting, you reduce your risk of a second heart attack.   Lung, head, and neck cancer patients: By quitting, you reduce your chance of a second cancer.   Parents of children and adolescents: By quitting, you protect your children from illnesses caused by secondhand smoke.  QUESTIONS TO THINK ABOUT Think about the following questions before you try to stop smoking. You may want to talk about your  answers with your caregiver.  Why do you want to quit?   If you tried to quit in the past, what helped and what did not?   What will be the most difficult situations for you after you quit? How will you plan to handle them?   Who can help you through the tough times? Your family? Friends? Caregiver?   What pleasures do you get from smoking? What ways can you still get pleasure if you quit?  Here are some questions to ask your caregiver:  How can you help me to be successful at quitting?   What medicine do you think would be best for me and how should I take it?   What should I do if I need more help?   What is smoking withdrawal like? How can I get information on withdrawal?  Quitting takes hard work and a lot of effort, but you can quit smoking. FOR MORE INFORMATION  Smokefree.gov (http://www.smokefree.gov) provides free, accurate, evidence-based information and professional assistance to help support the immediate and long-term needs of people trying to quit smoking. Document Released: 01/22/2001 Document Revised: 10/10/2010 Document Reviewed: 11/14/2008 ExitCare Patient Information 2012 [employer]. 

## 2011-03-15 NOTE — Progress Notes (Signed)
  Subjective:    Patient ID: [name], female    DOB: [birth year], 51 y.o.   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  Some low grade fever.  Patient smokes, knows she needs to quit and is committed to quitting having bought Nicorette gum.  Associated nasal congestion.   Has started her Qvar and now has a red, sore mouth.  Also has albuterol inhaler. No nausea or vomiting.     Review of Systems     Objective:   Physical Exam  Constitutional: She is oriented to person, place, and time. She appears well-developed and well-nourished.  HENT:  Head: Atraumatic.  Right Ear: External ear normal.  Left Ear: External ear normal.  Mouth/Throat: Oropharyngeal exudate: red tongue and mucous membranes.  Eyes: Conjunctivae and EOM are normal.  Neck: Neck supple.  Cardiovascular: Normal rate, regular rhythm, normal heart sounds and intact distal pulses.   Pulmonary/Chest: Effort normal. She has wheezes. She has no rales.  Musculoskeletal: Normal range of motion.  Neurological: She is oriented to person, place, and time.  Skin: Skin is dry.          Assessment & Plan:  Acute bronchitis Hypertension

## 2011-05-28 ENCOUNTER — Other Ambulatory Visit: Payer: Self-pay | Admitting: Family Medicine

## 2011-09-04 ENCOUNTER — Other Ambulatory Visit: Payer: Self-pay | Admitting: Family Medicine

## 2011-10-07 ENCOUNTER — Other Ambulatory Visit: Payer: Self-pay | Admitting: Family Medicine

## 2012-04-09 ENCOUNTER — Encounter: Payer: Self-pay | Admitting: Family Medicine

## 2012-04-09 ENCOUNTER — Telehealth: Payer: Self-pay | Admitting: Family Medicine

## 2012-04-09 ENCOUNTER — Ambulatory Visit (INDEPENDENT_AMBULATORY_CARE_PROVIDER_SITE_OTHER): Payer: BC Managed Care – PPO | Admitting: Family Medicine

## 2012-04-09 VITALS — BP 134/90 | HR 108 | Temp 98.2°F | Ht 60.75 in | Wt 142.0 lb

## 2012-04-09 DIAGNOSIS — J45909 Unspecified asthma, uncomplicated: Secondary | ICD-10-CM

## 2012-04-09 LAB — LDL CHOLESTEROL, DIRECT: Direct LDL: 102.1 mg/dL

## 2012-04-09 LAB — BASIC METABOLIC PANEL
BUN: 19 mg/dL (ref 6–23)
CO2: 32 mEq/L (ref 19–32)
Calcium: 9.2 mg/dL (ref 8.4–10.5)
Creatinine, Ser: 0.8 mg/dL (ref 0.4–1.2)
Glucose, Bld: 68 mg/dL — ABNORMAL LOW (ref 70–99)

## 2012-04-09 LAB — HEMOGLOBIN A1C: Hgb A1c MFr Bld: 5.2 % (ref 4.6–6.5)

## 2012-04-09 MED ORDER — ALBUTEROL SULFATE HFA 108 (90 BASE) MCG/ACT IN AERS: 2.0000 | INHALATION_SPRAY | Freq: Four times a day (QID) | RESPIRATORY_TRACT | Status: DC | PRN

## 2012-04-09 MED ORDER — OMEPRAZOLE 20 MG PO CPDR: 20.0000 mg | DELAYED_RELEASE_CAPSULE | Freq: Every day | ORAL | Status: DC

## 2012-04-09 MED ORDER — DULOXETINE HCL 60 MG PO CPEP: 60.0000 mg | ORAL_CAPSULE | Freq: Every day | ORAL | Status: DC

## 2012-04-09 MED ORDER — AMLODIPINE BESYLATE 5 MG PO TABS: 5.0000 mg | ORAL_TABLET | Freq: Every day | ORAL | Status: DC

## 2012-04-09 NOTE — Progress Notes (Signed)
Chief Complaint  Patient presents with  . Establish Care    HPI:  [name] is here to establish care.  Last PCP and physical: Used to see another family doctor in town who left. Saw doctor Baxley but was told she couldn't help her with her fibromyalgia. Used to see Dr. Thayer Jew in rheum. Sees doctor Lafayette Dragon in psychiatry for her psychiatric medications. Has been out of all medications for some time. She recently switched jobs and didn't have insurance for awhile but now back with insurance.  Has the following chronic problems and concerns today:  Patient Active Problem List  Diagnosis  . Fibromyalgia  . Hypertension   Fibromyalgia: -reports dx by rheum 20 years ago -taking tylenol 650 6x daily -has taken cymbalta and flexeril, neurontin and lyrica  -cymbalta helps her depression - was not out of this and has been taking -lyrica didn't help -hurts all the time, Dr. Baird Kay was not able to helps her  HTN: -used to be on norvasc - out of this  Gerd/IBS: -has hiatal hernia and reflux -out of omeprazole  Depression: -hospitalization in the past about 4 times for bad depression -never tried to kill herself but did have SI -on cymbalta -stable currently -used to be on seroquel -followed by Dr. Lafayette Dragon  Asthma: -dx by previous doctor -used to take dulera, singulair, and albuterol -symptoms haven't been bad recenlty -triggers are certain foods (wheat, corn and dairy)and cold, dogs, and grass Health Maintenance:  ROS: See pertinent positives and negatives per HPI.  Past Medical History  Diagnosis Date  . Depression   . GERD (gastroesophageal reflux disease)   . Hypertension   . IBS (irritable bowel syndrome)   . Fibromyalgia     Family History  Problem Relation Age of Onset  . Adopted: Yes    History   Social History  . Marital Status: Divorced    Spouse Name: N/A    Number of Children: N/A  . Years of Education: N/A   Social History Main Topics  .  Smoking status: Former Smoker -- 1 years    Types: Cigarettes  . Smokeless tobacco: None  . Alcohol Use: Yes     Comment: 1 glass of wine daily   . Drug Use: None  . Sexually Active: None   Other Topics Concern  . None   Social History Narrative   Work or School: Engineer, civil (consulting) for [employer]      Home Situation: lives with daughter      Spiritual Beliefs: christian      Lifestyle: no regular exercise, diet ok             Current outpatient prescriptions:acetaminophen (TYLENOL) 650 MG CR tablet, Take 650 mg by mouth every 8 (eight) hours as needed., Disp: , Rfl: ;  DULoxetine (CYMBALTA) 60 MG capsule, Take 1 capsule (60 mg total) by mouth daily., Disp: 90 capsule, Rfl: 3;  albuterol (PROVENTIL HFA;VENTOLIN HFA) 108 (90 BASE) MCG/ACT inhaler, Inhale 2 puffs into the lungs every 6 (six) hours as needed., Disp: 1 Inhaler, Rfl: 6 amLODipine (NORVASC) 5 MG tablet, Take 1 tablet (5 mg total) by mouth daily., Disp: 90 tablet, Rfl: 3;  docusate sodium (COLACE) 100 MG capsule, Take 100 mg by mouth 2 (two) times daily., Disp: , Rfl: ;  omeprazole (PRILOSEC) 20 MG capsule, Take 1 capsule (20 mg total) by mouth daily., Disp: 90 capsule, Rfl: 3  EXAM:  Filed Vitals:   04/09/12 0826  BP: 134/90  Pulse:  108  Temp: 98.2 F (36.8 C)    Body mass index is 27.05 kg/(m^2).  GENERAL: vitals reviewed and listed above, alert, oriented, appears well hydrated and in no acute distress  HEENT: atraumatic, conjunttiva clear, no obvious abnormalities on inspection of external nose and ears  NECK: no obvious masses on inspection  LUNGS: clear to auscultation bilaterally, no wheezes, rales or rhonchi, good air movement  CV: HRRR, no peripheral edema  MS: moves all extremities without noticeable abnormality  PSYCH: pleasant and cooperative, no obvious depression or anxiety  ASSESSMENT AND PLAN:  Discussed the following assessment and plan:  Asthma with acute exacerbation - Plan: albuterol  (PROVENTIL HFA;VENTOLIN HFA) 108 (90 BASE) MCG/ACT inhaler  Fibromyalgia - Plan: Ambulatory referral to Rheumatology  Hypertension - Plan: amLODipine (NORVASC) 5 MG tablet  GERD (gastroesophageal reflux disease) - Plan: omeprazole (PRILOSEC) 20 MG capsule  Depression - Plan: DULoxetine (CYMBALTA) 60 MG capsule  Asthma, chronic - Plan: Pulmonary function test  Establishing care with new doctor, encounter for - Plan: Basic metabolic panel, Lipid Panel, Hemoglobin A1c -We reviewed the PMH, PSH, FH, SH, Meds and Allergies. -We provided refills for any medications we will prescribe as needed. -advised psychiatric care, counseling, regular exercise, healthy diet, continuing cymbalta and referral to rheum for her fibro -she will see psych (Dr. Lafayette Dragon) for her depression -FASTING labs today -restarted norvasc, aluterol and omeprazole -PFTs to assess degree of asthma -follow up in 1 month for health maintenance and physical - pt late today and did not have time to address today  -Patient advised to return or notify a doctor immediately if symptoms worsen or persist or new concerns arise.  Patient Instructions  -We have ordered labs or studies at this visit. It can take up to 1-2 weeks for results and processing. We will contact you with instructions IF your results are abnormal. Normal results will be released to your [facility]. If you have not heard from [place] or can not find your results in [facility] in 2 weeks please contact our office.  -PLEASE SIGN UP FOR MYCHART TODAY   We recommend the following healthy lifestyle measures: - eat a healthy diet consisting of lots of vegetables, fruits, beans, nuts, seeds, healthy meats such as white chicken and fish and whole grains.  - avoid fried foods, fast food, processed foods, sodas, red meet and other fattening foods.  - get a least 150 minutes of aerobic exercise per week.   Restart the following medications: -omeprazole -norvasc -albuterol as  needed  Continue cymbalta  Schedule appointment with Dr. Lafayette Dragon  -We placed a referral for you as discussed to the rheumatologist. It usually takes about 1-2 weeks to process and schedule this referral. If you have not heard from [place] regarding this appointment in 2 weeks please contact our office.   Follow up in: 1 month      Rubye Strohmeyer R.

## 2012-04-09 NOTE — Telephone Encounter (Signed)
Pt returned call; called again and left vm.

## 2012-04-09 NOTE — Telephone Encounter (Signed)
Labs look good.

## 2012-04-09 NOTE — Patient Instructions (Addendum)
-  We have ordered labs or studies at this visit. It can take up to 1-2 weeks for results and processing. We will contact you with instructions IF your results are abnormal. Normal results will be released to your [facility]. If you have not heard from [place] or can not find your results in [facility] in 2 weeks please contact our office.  -PLEASE SIGN UP FOR MYCHART TODAY   We recommend the following healthy lifestyle measures: - eat a healthy diet consisting of lots of vegetables, fruits, beans, nuts, seeds, healthy meats such as white chicken and fish and whole grains.  - avoid fried foods, fast food, processed foods, sodas, red meet and other fattening foods.  - get a least 150 minutes of aerobic exercise per week.   Restart the following medications: -omeprazole -norvasc -albuterol as needed  Continue cymbalta  Schedule appointment with Dr. Lafayette Dragon  -We placed a referral for you as discussed to the rheumatologist. It usually takes about 1-2 weeks to process and schedule this referral. If you have not heard from [place] regarding this appointment in 2 weeks please contact our office.   Follow up in: 1 month

## 2012-04-10 NOTE — Telephone Encounter (Signed)
Called and spoke with pt and pt is aware.  

## 2012-05-01 ENCOUNTER — Encounter: Payer: Self-pay | Admitting: Family Medicine

## 2012-05-01 NOTE — Progress Notes (Signed)
Received OV note from 04/23/12 from Rheumatologist Dr. Kellie Simmering. He advised exercise and low dose flexeril prn qhs for her fibromyalgia, and follow up in 3 months.

## 2012-05-14 ENCOUNTER — Ambulatory Visit: Payer: BC Managed Care – PPO | Admitting: Family Medicine

## 2012-05-21 ENCOUNTER — Ambulatory Visit (INDEPENDENT_AMBULATORY_CARE_PROVIDER_SITE_OTHER): Payer: BC Managed Care – PPO | Admitting: Internal Medicine

## 2012-05-21 DIAGNOSIS — J45909 Unspecified asthma, uncomplicated: Secondary | ICD-10-CM

## 2012-05-21 LAB — PULMONARY FUNCTION TEST

## 2012-05-21 NOTE — Progress Notes (Signed)
PFT done today. 

## 2012-05-28 ENCOUNTER — Telehealth: Payer: Self-pay | Admitting: Family Medicine

## 2012-05-28 NOTE — Telephone Encounter (Signed)
Left a message for pt to return call 

## 2012-05-28 NOTE — Telephone Encounter (Signed)
Please let her know: -lung test results came back - mild asthma - advised albuterol as needed and let me know if worsening -needs to schedule physical - please help her schedule

## 2012-06-02 NOTE — Telephone Encounter (Signed)
Called and spoke with pt and pt is aware.  Pt states she will call back to schedule cpx appt.

## 2012-06-17 ENCOUNTER — Ambulatory Visit (INDEPENDENT_AMBULATORY_CARE_PROVIDER_SITE_OTHER): Payer: BC Managed Care – PPO | Admitting: Family Medicine

## 2012-06-17 ENCOUNTER — Telehealth: Payer: Self-pay | Admitting: Family Medicine

## 2012-06-17 ENCOUNTER — Ambulatory Visit: Payer: BC Managed Care – PPO

## 2012-06-17 VITALS — BP 118/84 | HR 106 | Temp 98.1°F | Resp 16 | Ht 60.0 in | Wt 136.0 lb

## 2012-06-17 DIAGNOSIS — K219 Gastro-esophageal reflux disease without esophagitis: Secondary | ICD-10-CM

## 2012-06-17 DIAGNOSIS — R0789 Other chest pain: Secondary | ICD-10-CM

## 2012-06-17 DIAGNOSIS — J45909 Unspecified asthma, uncomplicated: Secondary | ICD-10-CM

## 2012-06-17 DIAGNOSIS — R071 Chest pain on breathing: Secondary | ICD-10-CM

## 2012-06-17 MED ORDER — HYDROCODONE-ACETAMINOPHEN 5-325 MG PO TABS: ORAL_TABLET | ORAL | Status: DC

## 2012-06-17 NOTE — Telephone Encounter (Signed)
Let her know PFTs showed mild asthma with some response to the medicine. We can discuss at her physical exam.

## 2012-06-17 NOTE — Telephone Encounter (Signed)
Left a message for pt to return call 

## 2012-06-17 NOTE — Progress Notes (Signed)
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    Pain worse with deep breath, but she is not shorter of breath.  No bruise noted, but there has been some palpable swelling  Objective: NAD Chest:  Positive for rub on left side with inspiratory wheezes bilaterally, worse on left Skin:  No ecchymosis  [facility] reading (PRIMARY) by  Dr. Milus Glazier:  Rib films: suspicious for 11th rib fx, no effusion  Assessment: presumed rib fx  Plan:  norco at hs Limited lifing for 5 weeks  Signed,  Elvina Sidle, MD

## 2012-06-18 NOTE — Telephone Encounter (Signed)
Left a message for pt to return call 

## 2012-06-19 NOTE — Telephone Encounter (Signed)
Left a message for pt to return call. Letter mailed to pt's home address.

## 2012-06-20 ENCOUNTER — Telehealth: Payer: Self-pay

## 2012-06-20 NOTE — Telephone Encounter (Signed)
Patient called stated meds given Hydrocodone is making her itch really bad and would like Something else to be called in. [facility] [address] Please let pt know when done [phone]

## 2012-06-22 ENCOUNTER — Ambulatory Visit: Payer: BC Managed Care – PPO

## 2012-06-22 ENCOUNTER — Ambulatory Visit (INDEPENDENT_AMBULATORY_CARE_PROVIDER_SITE_OTHER): Payer: BC Managed Care – PPO | Admitting: Internal Medicine

## 2012-06-22 VITALS — BP 124/84 | HR 110 | Temp 99.0°F | Resp 16 | Ht 61.0 in | Wt 138.2 lb

## 2012-06-22 DIAGNOSIS — R202 Paresthesia of skin: Secondary | ICD-10-CM

## 2012-06-22 DIAGNOSIS — M5137 Other intervertebral disc degeneration, lumbosacral region: Secondary | ICD-10-CM

## 2012-06-22 DIAGNOSIS — M5410 Radiculopathy, site unspecified: Secondary | ICD-10-CM

## 2012-06-22 DIAGNOSIS — M47812 Spondylosis without myelopathy or radiculopathy, cervical region: Secondary | ICD-10-CM

## 2012-06-22 DIAGNOSIS — R209 Unspecified disturbances of skin sensation: Secondary | ICD-10-CM

## 2012-06-22 DIAGNOSIS — M5030 Other cervical disc degeneration, unspecified cervical region: Secondary | ICD-10-CM

## 2012-06-22 DIAGNOSIS — M479 Spondylosis, unspecified: Secondary | ICD-10-CM

## 2012-06-22 DIAGNOSIS — M5136 Other intervertebral disc degeneration, lumbar region: Secondary | ICD-10-CM

## 2012-06-22 DIAGNOSIS — M5412 Radiculopathy, cervical region: Secondary | ICD-10-CM

## 2012-06-22 MED ORDER — TRAMADOL HCL 50 MG PO TABS: 50.0000 mg | ORAL_TABLET | Freq: Three times a day (TID) | ORAL | Status: DC | PRN

## 2012-06-22 MED ORDER — CYCLOBENZAPRINE HCL 10 MG PO TABS: 10.0000 mg | ORAL_TABLET | Freq: Three times a day (TID) | ORAL | Status: DC | PRN

## 2012-06-22 MED ORDER — PREDNISONE 20 MG PO TABS: ORAL_TABLET | ORAL | Status: DC

## 2012-06-22 MED ORDER — MELOXICAM 15 MG PO TABS
15.0000 mg | ORAL_TABLET | Freq: Every day | ORAL | Status: DC
Start: 2012-06-22 — End: 2013-06-02

## 2012-06-22 NOTE — Progress Notes (Signed)
Subjective:    Patient ID: [name], female    DOB: 06/26/1960, 52 y.o.   MRN: [id number]  HPI This 52 y.o. female presents for evaluation of pain and numbness in the RIGHT arm/hand and LEFT leg.  It is of note that she fell  More than a week ago, and was evaluated here 06/17/2012 with LEFT chest wall pain.  She was diagnosed with a possible LEFT 11th rib fracture.  Over-read was negative.  She stopped the hydrocodone given due to itching.  On 06/20/2012 noticed numbess of the RIGHT hand and arm upon waking.  Thought she must have slept on it funny.  The hand feels cold, and it's "hard to do anything with it."  It comes and goes, but doesn't stay away long. Hasn't dropped anything, but feels so weird that she hasn't tried to pick up anything.  Numbess in the legs L>R began about the same time, "maybe earlier, but I was so focused on [the hand] that I didn't notice."  No loss of bowel or bladder control.  No urinary symptoms. No saddle anesthesia.  Pain shoots down legs from hips.  No swelling in the legs.  No facial droop, speech changes, confusion, altered mental status, HA.  Patient Active Problem List   Diagnosis Date Noted  . GERD (gastroesophageal reflux disease) 06/17/2012  . Asthma, chronic 06/17/2012  . Fibromyalgia 03/15/2011  . Hypertension 03/15/2011   Current Outpatient Prescriptions on File Prior to Visit  Medication Sig Dispense Refill  . acetaminophen (TYLENOL) 650 MG CR tablet Take 650 mg by mouth every 8 (eight) hours as needed.      Marland Kitchen albuterol (PROVENTIL HFA;VENTOLIN HFA) 108 (90 BASE) MCG/ACT inhaler Inhale 2 puffs into the lungs every 6 (six) hours as needed.  1 Inhaler  6  . amLODipine (NORVASC) 5 MG tablet Take 1 tablet (5 mg total) by mouth daily.  90 tablet  3  . DULoxetine (CYMBALTA) 60 MG capsule Take 120 mg by mouth daily.      Marland Kitchen omeprazole (PRILOSEC) 20 MG capsule Take 1 capsule (20 mg total) by mouth daily.  90 capsule  3  . QUEtiapine (SEROQUEL) 300 MG  tablet Take 300 mg by mouth at bedtime.      . docusate sodium (COLACE) 100 MG capsule Take 100 mg by mouth 2 (two) times daily.      Marland Kitchen HYDROcodone-acetaminophen (NORCO) 5-325 MG per tablet 1 tab q6h prn hs  30 tablet  0   Review of Systems As above.    Objective:   Physical Exam Blood pressure 124/84, pulse 110, temperature 99 F (37.2 C), temperature source Oral, resp. rate 16, height 5\' 1"  (1.549 m), weight 138 lb 3.2 oz (62.687 kg), last menstrual period 01/12/2011, SpO2 92.00%. Body mass index is 26.13 kg/(m^2). Well-developed, well nourished WF who is awake, alert and oriented, in NAD. She is holding a tissue in the RIGHT hand, and offers me her LEFT to shake, stating that the RIGHT "just feels too weird." She ambulates in the exam room, and climbs onto the exam table without difficulty. HEENT: [place]/AT, sclera and conjunctiva are clear.   Neck: supple, non-tender, no lymphadenopathy, thyromegaly. Heart: RRR, no murmur Lungs: normal effort, CTA Extremities: no cyanosis, clubbing or edema. She reports increased tenderness of the RIGHT arm on palpation, above her baseline. Back:  No tenderness of the spine of the neck or back. No paraspinous muscle tenderness. Neurologic:  Cranial nerves II-XII intact.  Strength in the upper  and lower extremities is equal bilaterally and DTRs are symmetric throughout. Sensation is "different." Grasping a single piece of paper between the thumb and 4th finger was weak on the RIGHT. Skin: warm and dry without rash. Psychologic: good mood and appropriate affect, normal speech and behavior.  CSPINE: [facility] reading (PRIMARY) by  Dr. Merla Riches. Diffuse chronic degenerative changes.  Spondylolisthesis at C4-5 with questionable fracture. She was placed in a soft collar. Once over-read indicated NO fracture, soft collar removed.  LSSPINE: [facility] reading (PRIMARY) by  Dr. Merla Riches.  Chronic changes throughout, with scoliosis.  Bridging spurring at T11-12, question old  compression fracture at T11.  About 4 mm spondylolisthesis at L4-5.      Assessment & Plan:  Radiculopathy of arm - Plan: DG Cervical Spine Complete  Paresthesia of bilateral legs - Plan: DG Lumbar Spine Complete  Paresthesia of right arm - Plan: DG Cervical Spine Complete  Degenerative disc disease, cervical - Plan: meloxicam (MOBIC) 15 MG tablet, predniSONE (DELTASONE) 20 MG tablet, cyclobenzaprine (FLEXERIL) 10 MG tablet, Ambulatory referral to Orthopedic Surgery  Degenerative disc disease, lumbar - Plan: meloxicam (MOBIC) 15 MG tablet, predniSONE (DELTASONE) 20 MG tablet, cyclobenzaprine (FLEXERIL) 10 MG tablet, Ambulatory referral to Orthopedic Surgery  Fernande Bras, PA-C Physician Assistant-Certified [facility]   I have reviewed and agree with documentation. Robert P. Merla Riches, M.D.

## 2012-06-22 NOTE — Telephone Encounter (Signed)
I am sorry she is itching.  Please advise her to use benadryl and or Zyrtec for the itching.  I will send her in some Ultram.  I updated her allergy list with this.

## 2012-06-22 NOTE — Patient Instructions (Addendum)
Wait to take the meloxicam (Mobic) until you have completed the prednisone. The cyclobenzaprine can cause drowsiness.

## 2012-06-23 DIAGNOSIS — M479 Spondylosis, unspecified: Secondary | ICD-10-CM | POA: Insufficient documentation

## 2012-06-23 DIAGNOSIS — M47812 Spondylosis without myelopathy or radiculopathy, cervical region: Secondary | ICD-10-CM | POA: Insufficient documentation

## 2012-06-23 NOTE — Telephone Encounter (Signed)
Called her to advise.  

## 2012-06-23 NOTE — Telephone Encounter (Signed)
Left message for her

## 2012-06-30 ENCOUNTER — Emergency Department (HOSPITAL_COMMUNITY)
Admission: EM | Admit: 2012-06-30 | Discharge: 2012-07-01 | Disposition: A | Discharge status: Home / Self Care | Payer: BC Managed Care – PPO | Attending: Emergency Medicine | Admitting: Emergency Medicine

## 2012-06-30 ENCOUNTER — Encounter (HOSPITAL_COMMUNITY): Payer: Self-pay | Admitting: Emergency Medicine

## 2012-06-30 DIAGNOSIS — F329 Major depressive disorder, single episode, unspecified: Secondary | ICD-10-CM | POA: Insufficient documentation

## 2012-06-30 DIAGNOSIS — K649 Unspecified hemorrhoids: Secondary | ICD-10-CM

## 2012-06-30 DIAGNOSIS — K644 Residual hemorrhoidal skin tags: Secondary | ICD-10-CM | POA: Insufficient documentation

## 2012-06-30 DIAGNOSIS — IMO0001 Reserved for inherently not codable concepts without codable children: Secondary | ICD-10-CM | POA: Insufficient documentation

## 2012-06-30 DIAGNOSIS — R209 Unspecified disturbances of skin sensation: Secondary | ICD-10-CM | POA: Insufficient documentation

## 2012-06-30 DIAGNOSIS — Z9089 Acquired absence of other organs: Secondary | ICD-10-CM | POA: Insufficient documentation

## 2012-06-30 DIAGNOSIS — J45909 Unspecified asthma, uncomplicated: Secondary | ICD-10-CM | POA: Insufficient documentation

## 2012-06-30 DIAGNOSIS — Z87891 Personal history of nicotine dependence: Secondary | ICD-10-CM | POA: Insufficient documentation

## 2012-06-30 DIAGNOSIS — I10 Essential (primary) hypertension: Secondary | ICD-10-CM | POA: Insufficient documentation

## 2012-06-30 DIAGNOSIS — K219 Gastro-esophageal reflux disease without esophagitis: Secondary | ICD-10-CM | POA: Insufficient documentation

## 2012-06-30 DIAGNOSIS — Z8781 Personal history of (healed) traumatic fracture: Secondary | ICD-10-CM | POA: Insufficient documentation

## 2012-06-30 DIAGNOSIS — R1013 Epigastric pain: Secondary | ICD-10-CM | POA: Insufficient documentation

## 2012-06-30 DIAGNOSIS — Z8679 Personal history of other diseases of the circulatory system: Secondary | ICD-10-CM | POA: Insufficient documentation

## 2012-06-30 DIAGNOSIS — F411 Generalized anxiety disorder: Secondary | ICD-10-CM | POA: Insufficient documentation

## 2012-06-30 DIAGNOSIS — R011 Cardiac murmur, unspecified: Secondary | ICD-10-CM | POA: Insufficient documentation

## 2012-06-30 DIAGNOSIS — G43909 Migraine, unspecified, not intractable, without status migrainosus: Secondary | ICD-10-CM | POA: Insufficient documentation

## 2012-06-30 DIAGNOSIS — Z8719 Personal history of other diseases of the digestive system: Secondary | ICD-10-CM | POA: Insufficient documentation

## 2012-06-30 DIAGNOSIS — K5900 Constipation, unspecified: Secondary | ICD-10-CM

## 2012-06-30 DIAGNOSIS — Z79899 Other long term (current) drug therapy: Secondary | ICD-10-CM | POA: Insufficient documentation

## 2012-06-30 DIAGNOSIS — F3289 Other specified depressive episodes: Secondary | ICD-10-CM | POA: Insufficient documentation

## 2012-06-30 NOTE — ED Notes (Signed)
Pt states she thinks she may have overdosed on her seroquel  Pt states she was taking 3 tabs a day and was switched to the kind you take once a day but she states she has been taking two a day for the past couple of weeks and just noticed she was only supposed to be taking one a day today

## 2012-06-30 NOTE — ED Notes (Signed)
Pt states on the 7th she fell out of her bathtub and broke a rib on the left side  Pt states she has been having pain in her arm and her left leg  Pt states she was put on prednisone  Pt states tonight she his having pain in her epigastric area that started about 1800  Pt denies nausea or vomiting  Pt states she has not had a bowel movement in about a week and a half  Pt states she has a hiatal hernia

## 2012-07-01 ENCOUNTER — Emergency Department (HOSPITAL_COMMUNITY): Payer: BC Managed Care – PPO

## 2012-07-01 LAB — COMPREHENSIVE METABOLIC PANEL
AST: 50 U/L — ABNORMAL HIGH (ref 0–37)
Alkaline Phosphatase: 117 U/L (ref 39–117)
CO2: 29 mEq/L (ref 19–32)
Chloride: 98 mEq/L (ref 96–112)
Creatinine, Ser: 0.7 mg/dL (ref 0.50–1.10)
GFR calc non Af Amer: >90 mL/min (ref >90)
Potassium: 3.4 mEq/L — ABNORMAL LOW (ref 3.5–5.1)
Total Bilirubin: 0.4 mg/dL (ref 0.3–1.2)

## 2012-07-01 LAB — CBC WITH DIFFERENTIAL/PLATELET
Eosinophils Relative: 2 % (ref 0–5)
HCT: 41.4 % (ref 36.0–46.0)
Lymphs Abs: 2.6 10*3/uL (ref 0.7–4.0)
MCV: 95.6 fL (ref 78.0–100.0)
Monocytes Relative: 7 % (ref 3–12)
Neutro Abs: 4.8 10*3/uL (ref 1.7–7.7)
Platelets: ADEQUATE 10*3/uL (ref 150–400)
RBC: 4.33 MIL/uL (ref 3.87–5.11)
WBC: 8.2 10*3/uL (ref 4.0–10.5)

## 2012-07-01 MED ORDER — FLEET ENEMA 7-19 GM/118ML RE ENEM
1.0000 | ENEMA | Freq: Once | RECTAL | Status: AC
  Administered 2012-07-01: 1 via RECTAL
  Filled 2012-07-01: qty 1

## 2012-07-01 MED ORDER — POLYETHYLENE GLYCOL 3350 17 G PO PACK: 17.0000 g | PACK | Freq: Every day | ORAL | Status: DC

## 2012-07-01 MED ORDER — MAGNESIUM CITRATE PO SOLN
1.0000 | Freq: Once | ORAL | Status: AC
  Administered 2012-07-01: 1 via ORAL
  Filled 2012-07-01: qty 296

## 2012-07-01 MED ORDER — ONDANSETRON 8 MG PO TBDP: 8.0000 mg | ORAL_TABLET | Freq: Three times a day (TID) | ORAL | Status: DC | PRN

## 2012-07-01 MED ORDER — ONDANSETRON 8 MG PO TBDP
8.0000 mg | ORAL_TABLET | Freq: Once | ORAL | Status: AC
  Administered 2012-07-01: 8 mg via ORAL
  Filled 2012-07-01: qty 1

## 2012-07-01 NOTE — Discharge Instructions (Signed)
Increase the fiber in your diet.  Make sure that you are  drinking enough water.  Take MiraLAX as prescribed.  Followup with your Dr. for recheck in 2-3 days.   Abdominal Pain Abdominal pain can be caused by many things. Your caregiver decides the seriousness of your pain by an examination and possibly blood tests and X-rays. Many cases can be observed and treated at home. Most abdominal pain is not caused by a disease and will probably improve without treatment. However, in many cases, more time must pass before a clear cause of the pain can be found. Before that point, it may not be known if you need more testing, or if hospitalization or surgery is needed. HOME CARE INSTRUCTIONS   Do not take laxatives unless directed by your caregiver.  Take pain medicine only as directed by your caregiver.  Only take over-the-counter or prescription medicines for pain, discomfort, or fever as directed by your caregiver.  Try a clear liquid diet (broth, tea, or water) for as long as directed by your caregiver. Slowly move to a bland diet as tolerated. SEEK IMMEDIATE MEDICAL CARE IF:   The pain does not go away.  You have a fever.  You keep throwing up (vomiting).  The pain is felt only in portions of the abdomen. Pain in the right side could possibly be appendicitis. In an adult, pain in the left lower portion of the abdomen could be colitis or diverticulitis.  You pass bloody or black tarry stools. MAKE SURE YOU:   Understand these instructions.  Will watch your condition.  Will get help right away if you are not doing well or get worse. Document Released: 11/07/2004 Document Revised: 04/22/2011 Document Reviewed: 09/16/2007 [facility] Patient Information 2014 [place], [place].  Constipation, Adult Constipation is when a person has fewer than 3 bowel movements a week; has difficulty having a bowel movement; or has stools that are dry, hard, or larger than normal. As people grow older,  constipation is more common. If you try to fix constipation with medicines that make you have a bowel movement (laxatives), the problem may get worse. Long-term laxative use may cause the muscles of the colon to become weak. A low-fiber diet, not taking in enough fluids, and taking certain medicines may make constipation worse. CAUSES   Certain medicines, such as antidepressants, pain medicine, iron supplements, antacids, and water pills.   Certain diseases, such as diabetes, irritable bowel syndrome (IBS), thyroid disease, or depression.   Not drinking enough water.   Not eating enough fiber-rich foods.   Stress or travel.  Lack of physical activity or exercise.  Not going to the restroom when there is the urge to have a bowel movement.  Ignoring the urge to have a bowel movement.  Using laxatives too much. SYMPTOMS   Having fewer than 3 bowel movements a week.   Straining to have a bowel movement.   Having hard, dry, or larger than normal stools.   Feeling full or bloated.   Pain in the lower abdomen.  Not feeling relief after having a bowel movement. DIAGNOSIS  Your caregiver will take a medical history and perform a physical exam. Further testing may be done for severe constipation. Some tests may include:   A barium enema X-ray to examine your rectum, colon, and sometimes, your small intestine.  A sigmoidoscopy to examine your lower colon.  A colonoscopy to examine your entire colon. TREATMENT  Treatment will depend on the severity of your constipation and  what is causing it. Some dietary treatments include drinking more fluids and eating more fiber-rich foods. Lifestyle treatments may include regular exercise. If these diet and lifestyle recommendations do not help, your caregiver may recommend taking over-the-counter laxative medicines to help you have bowel movements. Prescription medicines may be prescribed if over-the-counter medicines do not work.  HOME  CARE INSTRUCTIONS   Increase dietary fiber in your diet, such as fruits, vegetables, whole grains, and beans. Limit high-fat and processed sugars in your diet, such as [place] fries, hamburgers, cookies, candies, and soda.   A fiber supplement may be added to your diet if you cannot get enough fiber from foods.   Drink enough fluids to keep your urine clear or pale yellow.   Exercise regularly or as directed by your caregiver.   Go to the restroom when you have the urge to go. Do not hold it.  Only take medicines as directed by your caregiver. Do not take other medicines for constipation without talking to your caregiver first. SEEK IMMEDIATE MEDICAL CARE IF:   You have bright red blood in your stool.   Your constipation lasts for more than 4 days or gets worse.   You have abdominal or rectal pain.   You have thin, pencil-like stools.  You have unexplained weight loss. MAKE SURE YOU:   Understand these instructions.  Will watch your condition.  Will get help right away if you are not doing well or get worse. Document Released: 10/27/2003 Document Revised: 04/22/2011 Document Reviewed: 01/01/2011 [facility] Patient Information 2014 [place], [place].  Fiber Content in Foods Drinking plenty of fluids and consuming foods high in fiber can help with constipation. See the list below for the fiber content of some common foods. Starches and Grains / Dietary Fiber (g)  Cheerios, 1 cup / 3 g  Kellogg's Corn Flakes, 1 cup / 0.7 g  Rice Krispies, 1  cup / 0.3 g  Quaker Oat Life Cereal,  cup / 2.1 g  Oatmeal, instant (cooked),  cup / 2 g  Kellogg's Frosted Mini Wheats, 1 cup / 5.1 g  Rice, brown, long-grain (cooked), 1 cup / 3.5 g  Rice, white, long-grain (cooked), 1 cup / 0.6 g  Macaroni, cooked, enriched, 1 cup / 2.5 g Legumes / Dietary Fiber (g)  Beans, baked, canned, plain or vegetarian,  cup / 5.2 g  Beans, kidney, canned,  cup / 6.8 g  Beans, pinto, dried  (cooked),  cup / 7.7 g  Beans, pinto, canned,  cup / 5.5 g Breads and Crackers / Dietary Fiber (g)  Graham crackers, plain or honey, 2 squares / 0.7 g  Saltine crackers, 3 squares / 0.3 g  Pretzels, plain, salted, 10 pieces / 1.8 g  Bread, whole-wheat, 1 slice / 1.9 g  Bread, white, 1 slice / 0.7 g  Bread, raisin, 1 slice / 1.2 g  Bagel, plain, 3 oz / 2 g  Tortilla, flour, 1 oz / 0.9 g  Tortilla, corn, 1 small / 1.5 g  Bun, hamburger or hotdog, 1 small / 0.9 g Fruits / Dietary Fiber (g)  Apple, raw with skin, 1 medium / 4.4 g  Applesauce, sweetened,  cup / 1.5 g  Banana,  medium / 1.5 g  Grapes, 10 grapes / 0.4 g  Orange, 1 small / 2.3 g  Raisin, 1.5 oz / 1.6 g  Melon, 1 cup / 1.4 g Vegetables / Dietary Fiber (g)  Green beans, canned,  cup / 1.3 g  Carrots (cooked),  cup / 2.3 g  Broccoli (cooked),  cup / 2.8 g  Peas, frozen (cooked),  cup / 4.4 g  Potatoes, mashed,  cup / 1.6 g  Lettuce, 1 cup / 0.5 g  Corn, canned,  cup / 1.6 g  Tomato,  cup / 1.1 g Document Released: 06/16/2006 Document Revised: 04/22/2011 Document Reviewed: 08/11/2006 [employer] Patient Information 2014 [place], [place].  Hemorrhoids Hemorrhoids are puffy (swollen) veins around the rectum or anus. Hemorrhoids can cause pain, itching, bleeding, or irritation. HOME CARE  Eat foods with fiber, such as whole grains, beans, nuts, fruits, and vegetables. Ask your doctor about taking products with added fiber in them (fibersupplements).  Drink enough fluid to keep your pee (urine) clear or pale yellow.  Exercise often.  Go to the bathroom when you have the urge to poop. Do not wait.  Avoid straining to poop (bowel movement).  Keep the butt area dry and clean. Use wet toilet paper or moist paper towels.  Medicated creams and medicine inserted into the anus (anal suppository) may be used or applied as told.  Only take medicine as told by your doctor.  Take a warm  water bath (sitz bath) for 15 20 minutes to ease pain. Do this 3 4 times a day.  Place ice packs on the area if it is tender or puffy. Use the ice packs between the warm water baths.  Put ice in a plastic bag.  Place a towel between your skin and the bag.  Leave the ice on for 15-20 minutes, 3-4 times a day.  Do not use a donut-shaped pillow or sit on the toilet for a long time. GET HELP RIGHT AWAY IF:   You have more pain that is not controlled by treatment or medicine.  You have bleeding that will not stop.  You have trouble or are unable to poop (bowel movement).  You have pain or puffiness outside the area of the hemorrhoids. MAKE SURE YOU:   Understand these instructions.  Will watch your condition.  Will get help right away if you are not doing well or get worse. Document Released: 11/07/2007 Document Revised: 01/15/2012 Document Reviewed: 12/10/2011 [facility] Patient Information 2014 [place], [place].

## 2012-07-01 NOTE — ED Provider Notes (Signed)
History     CSN: [id number]  Arrival date & time 06/30/12  2310   First MD Initiated Contact with Patient 07/01/12 0153      Chief Complaint  Patient presents with  . Abdominal Pain    (Consider location/radiation/quality/duration/timing/severity/associated sxs/prior treatment) HPI 52 year old female presents to emergency room with complaint of abdominal pain.  Patient reports she is currently being worked up for fibromyalgia, and numbness and tingling in her right arm.  Patient reports she fell early in the month, and was told she had a fracture in a rib on her left side.  Patient, reports she's had decreased appetite, ongoing for several months.  Over the last week, and a half, she is not been able to eat more than one to 2 meals a day.  She reports she has not had a bowel movement in about 10 days.  Normally she takes Colace, but ran out.  Today, she tried MiraLAX once.  She has not tried any other treatment for her constipation.  Abdominal pain is in her epigastrium.  She is currently on omeprazole for GERD.  She also reports she has a history of hiatal hernia.  She has not yet seen Gastroenterology for her abdominal pain.  Pain worsened around 6:00.  She denies any nausea and vomiting, no fevers.  Past Medical History  Diagnosis Date  . Depression   . GERD (gastroesophageal reflux disease)   . Hypertension   . IBS (irritable bowel syndrome)   . Fibromyalgia   . Asthma   . Headache     frequent  . Seasonal allergies   . Heart murmur   . Migraines   . Positive TB test   . Hiatal hernia   . Anxiety     Past Surgical History  Procedure Laterality Date  . Cesarean section    . Cholecystectomy      Family History  Problem Relation Age of Onset  . Adopted: Yes  . Mental illness      parent  . Diabetes      parent     History  Substance Use Topics  . Smoking status: Former Smoker -- 1 years    Types: Cigarettes  . Smokeless tobacco: Not on file  . Alcohol Use: Yes      Comment: 2-3/wk    OB History   Grav Para Term Preterm Abortions TAB SAB Ect Mult Living                  Review of Systems  All other systems reviewed and are negative.    Allergies  Hydrocodone  Home Medications   Current Outpatient Rx  Name  Route  Sig  Dispense  Refill  . acetaminophen (TYLENOL) 500 MG tablet   Oral   Take 500 mg by mouth every 6 (six) hours as needed for pain.         Marland Kitchen albuterol (PROVENTIL HFA;VENTOLIN HFA) 108 (90 BASE) MCG/ACT inhaler   Inhalation   Inhale 2 puffs into the lungs every 6 (six) hours as needed.   1 Inhaler   6   . cyclobenzaprine (FLEXERIL) 10 MG tablet   Oral   Take 1 tablet (10 mg total) by mouth 3 (three) times daily as needed for muscle spasms.   30 tablet   0   . docusate sodium (COLACE) 100 MG capsule   Oral   Take 100 mg by mouth daily.         . DULoxetine (CYMBALTA)  60 MG capsule   Oral   Take 120 mg by mouth daily.         . meloxicam (MOBIC) 15 MG tablet   Oral   Take 1 tablet (15 mg total) by mouth daily.   30 tablet   1   . omeprazole (PRILOSEC) 20 MG capsule   Oral   Take 1 capsule (20 mg total) by mouth daily.   90 capsule   3   . polyethylene glycol (MIRALAX / GLYCOLAX) packet   Oral   Take 17 g by mouth daily.         . predniSONE (DELTASONE) 20 MG tablet      Take 3 PO QAM x3days, 2 PO QAM x3days, 1 PO QAM x3days   18 tablet   0   . QUEtiapine (SEROQUEL XR) 300 MG 24 hr tablet   Oral   Take 300 mg by mouth at bedtime.         . traMADol (ULTRAM) 50 MG tablet   Oral   Take 1-2 tablets (50-100 mg total) by mouth every 8 (eight) hours as needed for pain.   30 tablet   0     BP 161/102  Pulse 99  Temp(Src) 99 F (37.2 C) (Oral)  Resp 18  Wt 138 lb (62.596 kg)  BMI 26.09 kg/m2  SpO2 96%  LMP 01/12/2011  Physical Exam  Nursing note and vitals reviewed. Constitutional: She is oriented to person, place, and time. She appears well-developed and well-nourished.   Anxious appearing  HENT:  Head: Normocephalic and atraumatic.  Nose: Nose normal.  Mouth/Throat: Oropharynx is clear and moist.  Eyes: Conjunctivae and EOM are normal. Pupils are equal, round, and reactive to light.  Neck: Normal range of motion. Neck supple. No JVD present. No tracheal deviation present. No thyromegaly present.  Cardiovascular: Normal rate, regular rhythm, normal heart sounds and intact distal pulses.  Exam reveals no gallop and no friction rub.   No murmur heard. Pulmonary/Chest: Effort normal and breath sounds normal. No stridor. No respiratory distress. She has no wheezes. She has no rales. She exhibits no tenderness.  Abdominal: Soft. Bowel sounds are normal. She exhibits no distension and no mass. There is tenderness (mild epigastric tenderness). There is no rebound and no guarding.  Genitourinary:  Rectal exam performed.  Patient has small, nonthrombosed hemorrhoid noted externally.  There is no impacted stool in her vault.  Musculoskeletal: Normal range of motion. She exhibits no edema and no tenderness.  Lymphadenopathy:    She has no cervical adenopathy.  Neurological: She is alert and oriented to person, place, and time. She exhibits normal muscle tone. Coordination normal.  Skin: Skin is warm and dry. No rash noted. No erythema. No pallor.  Psychiatric: She has a normal mood and affect. Her behavior is normal. Judgment and thought content normal.    ED Course  Procedures (including critical care time)  Labs Reviewed  COMPREHENSIVE METABOLIC PANEL - Abnormal; Notable for the following:    Potassium 3.4 (*)    Albumin 3.3 (*)    AST 50 (*)    ALT 67 (*)    All other components within normal limits  CBC WITH DIFFERENTIAL  LIPASE, BLOOD   Dg Abd Acute W/chest  07/01/2012   *RADIOLOGY REPORT*  Clinical Data: Abdominal pain and constipation.  ACUTE ABDOMEN SERIES (ABDOMEN 2 VIEW & CHEST 1 VIEW)  Comparison: None.  Findings: The upright chest x-ray  demonstrates no acute cardiopulmonary findings.  Moderate thoracolumbar scoliosis is noted.  Mild apical pleural thickening is noted.  The bony thorax is intact.  Two views of the abdomen demonstrate a moderate-to-large amount of stool in the colon suggesting constipation.  No findings for small bowel obstruction or free air.  The soft tissue shadows of the abdomen are grossly maintained.  The bony structures are intact.  IMPRESSION:  1.  No acute cardiopulmonary findings. 2.  Moderate to large amount of stool throughout the colon suggesting constipation.  No findings for small bowel obstruction.   Original Report Authenticated By: Rudie Meyer, M.D.     1. Epigastric pain   2. Constipation   3. Hemorrhoid       MDM  52 year old female with significant constipation noted on x-ray.  Labs are otherwise unremarkable.  Suspect constipation as cause of patient's symptoms.  We'll give her mag citrate here in the emergency room as well as a fleets enema.  We'll have her start on good bowel regimen with MiraLAX as outpatient and close followup with her primary care Dr.  4:49 AM Of note, patient reports she has been accidentally taking 2 seroquels at night for sleep.  She reports since she has been doing this, she has felt spacey.  Pt instructed to only take one, and f/u with her pcm if her sxs continue.        Olivia Mackie, MD 07/01/12 [id number]

## 2012-07-01 NOTE — ED Notes (Addendum)
Pt cannot remember when was the last time she had a BM.  Pt states she doesn't "feel right".  Pt a/o x4.  Skin warm and dry.

## 2012-07-01 NOTE — ED Notes (Signed)
Pt made aware of need for urine sample. Pt is unable to void at this time.

## 2012-07-28 ENCOUNTER — Encounter: Payer: Self-pay | Admitting: Family Medicine

## 2012-07-28 ENCOUNTER — Ambulatory Visit (INDEPENDENT_AMBULATORY_CARE_PROVIDER_SITE_OTHER): Payer: BC Managed Care – PPO | Admitting: Family Medicine

## 2012-07-28 VITALS — BP 124/80 | Temp 98.5°F | Wt 141.0 lb

## 2012-07-28 DIAGNOSIS — F32A Depression, unspecified: Secondary | ICD-10-CM

## 2012-07-28 DIAGNOSIS — K219 Gastro-esophageal reflux disease without esophagitis: Secondary | ICD-10-CM

## 2012-07-28 DIAGNOSIS — K5900 Constipation, unspecified: Secondary | ICD-10-CM

## 2012-07-28 DIAGNOSIS — F329 Major depressive disorder, single episode, unspecified: Secondary | ICD-10-CM

## 2012-07-28 MED ORDER — OMEPRAZOLE 40 MG PO CPDR: 40.0000 mg | DELAYED_RELEASE_CAPSULE | Freq: Every day | ORAL | Status: DC

## 2012-07-28 NOTE — Patient Instructions (Signed)
Constipation: -daily fiber supplement such as metamucil or citracel every day -miralax 1-2x daily as needed to ensure daily soft bowel movement  Increase prilosec to 40mg  daily  Follow up with you psychiatrist for you depression  Schedule you physical exam

## 2012-07-28 NOTE — Progress Notes (Signed)
Chief Complaint  Patient presents with  . Depression  . Hiatal Hernia  . Constipation  . Menopause    HPI:  Acute visit for GERD, constipation: -chronic constipation and GERD and hiatal hernia and dx of IBS in the past -seen in ED for this recently and advised was constipation - tx with enema  -sometimes goes a few weeks without BM -has been doing mirilax daily and if does this does have BMs, but if stops the mirilax does not have BM, last BM about 5 days ago -does not take a fiber supplement -trying to eat more fiber -takes prilosec daily, but heartburn and reflux has worsened -has not seen GI in a very long time per her report and she wants referral to see them -denies: fevers, chills, weight loss, blood in stools  Depression: -followed by Dr. Lafayette Dragon in psychiatry, has close follow up -on cymbalta, seroquel and Wellbutrin, has been on paxil in the past which worked well for her and menopause so she may be changing to this -denies: SI or thoughts of self harm  Health maintenance: -needs physical, has not scheduled  ROS: See pertinent positives and negatives per HPI.  Past Medical History  Diagnosis Date  . Depression   . GERD (gastroesophageal reflux disease)   . Hypertension   . IBS (irritable bowel syndrome)   . Fibromyalgia   . Asthma   . Headache(784.0)     frequent  . Seasonal allergies   . Heart murmur   . Migraines   . Positive TB test   . Hiatal hernia   . Anxiety     Family History  Problem Relation Age of Onset  . Adopted: Yes  . Mental illness      parent  . Diabetes      parent     History   Social History  . Marital Status: Divorced    Spouse Name: N/A    Number of Children: N/A  . Years of Education: N/A   Occupational History  . RN [employer]   Social History Main Topics  . Smoking status: Former Smoker -- 1 years    Types: Cigarettes  . Smokeless tobacco: None  . Alcohol Use: Yes     Comment: 2-3/wk  . Drug Use: No  .  Sexually Active: No   Other Topics Concern  . None   Social History Narrative   Work or School: Engineer, civil (consulting) for [employer]      Home Situation: lives with daughter      Spiritual Beliefs: christian      Lifestyle: no regular exercise, diet ok             Current outpatient prescriptions:acetaminophen (TYLENOL) 500 MG tablet, Take 500 mg by mouth every 6 (six) hours as needed for pain., Disp: , Rfl: ;  albuterol (PROVENTIL HFA;VENTOLIN HFA) 108 (90 BASE) MCG/ACT inhaler, Inhale 2 puffs into the lungs every 6 (six) hours as needed., Disp: 1 Inhaler, Rfl: 6 cyclobenzaprine (FLEXERIL) 10 MG tablet, Take 1 tablet (10 mg total) by mouth 3 (three) times daily as needed for muscle spasms., Disp: 30 tablet, Rfl: 0;  docusate sodium (COLACE) 100 MG capsule, Take 100 mg by mouth daily., Disp: , Rfl: ;  DULoxetine (CYMBALTA) 60 MG capsule, Take 120 mg by mouth daily., Disp: , Rfl: ;  meloxicam (MOBIC) 15 MG tablet, Take 1 tablet (15 mg total) by mouth daily., Disp: 30 tablet, Rfl: 1 omeprazole (PRILOSEC) 40 MG capsule, Take 1  capsule (40 mg total) by mouth daily., Disp: 90 capsule, Rfl: 3;  ondansetron (ZOFRAN ODT) 8 MG disintegrating tablet, Take 1 tablet (8 mg total) by mouth every 8 (eight) hours as needed for nausea., Disp: 20 tablet, Rfl: 0;  polyethylene glycol (MIRALAX / GLYCOLAX) packet, Take 17 g by mouth daily. Use twice a day until having loose stools, then back off to just once a day., Disp: 14 each, Rfl: 0 predniSONE (DELTASONE) 20 MG tablet, Take 3 PO QAM x3days, 2 PO QAM x3days, 1 PO QAM x3days, Disp: 18 tablet, Rfl: 0;  QUEtiapine (SEROQUEL XR) 300 MG 24 hr tablet, Take 300 mg by mouth at bedtime., Disp: , Rfl: ;  traMADol (ULTRAM) 50 MG tablet, Take 1-2 tablets (50-100 mg total) by mouth every 8 (eight) hours as needed for pain., Disp: 30 tablet, Rfl: 0  EXAM:  Filed Vitals:   07/28/12 1407  BP: 124/80  Temp: 98.5 F (36.9 C)    Body mass index is 26.66 kg/(m^2).  GENERAL:  vitals reviewed and listed above, alert, oriented, appears well hydrated and in no acute distress  HEENT: atraumatic, conjunttiva clear, no obvious abnormalities on inspection of external nose and ears  NECK: no obvious masses on inspection  LUNGS: clear to auscultation bilaterally, no wheezes, rales or rhonchi, good air movement  CV: HRRR, no peripheral edema  MS: moves all extremities without noticeable abnormality  PSYCH: pleasant and cooperative, no obvious depression or anxiety  ASSESSMENT AND PLAN:  Discussed the following assessment and plan:  Constipation - Plan: Ambulatory referral to Gastroenterology  GERD (gastroesophageal reflux disease) - Plan: Ambulatory referral to Gastroenterology, omeprazole (PRILOSEC) 40 MG capsule  Depression  -worsening GERD despite PPI, worsening constipation on bowel regimen, wants to see GI and advised she likely needs EGD/colonoscopy - referral placed -have advised fiber regimen, mirilax for constipation and increasing prilosec until sees gastroenterologist - suspect GERD, IBS-C -she will follow up with psych regarding her depression -Patient advised to return or notify a doctor immediately if symptoms worsen or persist or new concerns arise.  Patient Instructions  Constipation: -daily fiber supplement such as metamucil or citracel every day -miralax 1-2x daily as needed to ensure daily soft bowel movement  Increase prilosec to 40mg  daily  Follow up with you psychiatrist for you depression  Schedule you physical exam     Aarvi Stotts R.

## 2012-08-03 ENCOUNTER — Encounter: Payer: Self-pay | Admitting: Internal Medicine

## 2012-08-20 ENCOUNTER — Encounter: Payer: Self-pay | Admitting: Family Medicine

## 2012-08-20 DIAGNOSIS — Z0289 Encounter for other administrative examinations: Secondary | ICD-10-CM

## 2012-08-20 NOTE — Progress Notes (Signed)
No Show This encounter was created in error - please disregard. 

## 2012-08-25 ENCOUNTER — Ambulatory Visit: Payer: BC Managed Care – PPO | Admitting: Internal Medicine

## 2013-01-22 ENCOUNTER — Telehealth: Payer: Self-pay | Admitting: Family Medicine

## 2013-01-22 NOTE — Telephone Encounter (Signed)
Ok with me if ok with Dr. Panosh

## 2013-01-22 NOTE — Telephone Encounter (Signed)
Pt would like to switch to [employer]

## 2013-03-15 ENCOUNTER — Telehealth: Payer: Self-pay | Admitting: Family Medicine

## 2013-03-15 NOTE — Telephone Encounter (Signed)
Pt would like to switch to you from dr kim.  There was a phone note sent on 01/22/13, but I could not open it (???) Dr Selena BattenKim states ok if ok w/ you.

## 2013-03-16 NOTE — Telephone Encounter (Signed)
Ok  But  Depression management should go through psych.  May be a while to get next 30 minute newer  appt because backed up schedules

## 2013-03-17 ENCOUNTER — Telehealth: Payer: Self-pay | Admitting: Family Medicine

## 2013-03-17 NOTE — Telephone Encounter (Addendum)
Pt is needing a referral to dr ray for back surgery. Pt will callback with dr phone # etc

## 2013-03-17 NOTE — Telephone Encounter (Signed)
Lm for pt to return my call/kh

## 2013-03-19 ENCOUNTER — Telehealth: Payer: Self-pay | Admitting: Family Medicine

## 2013-03-19 DIAGNOSIS — M47812 Spondylosis without myelopathy or radiculopathy, cervical region: Secondary | ICD-10-CM

## 2013-03-19 DIAGNOSIS — M479 Spondylosis, unspecified: Secondary | ICD-10-CM

## 2013-03-19 NOTE — Telephone Encounter (Signed)
It looks like she saw another doctor for back issues and was referred to an ortho doctor. Did she see the orthopedic doctor? Ok to place referral - but I have not seen her for this.

## 2013-03-19 NOTE — Telephone Encounter (Signed)
Left a message for return call.  

## 2013-03-19 NOTE — Telephone Encounter (Signed)
Pt would like Dr Trey SailorsMark Roy  [phone] [place]  Pt has dx w. degenerative disk disease.  this is a neuro md [facility]

## 2013-03-19 NOTE — Telephone Encounter (Signed)
Pt would like referral to Dr Trey SailorsMark Roy  [phone] [place]  Pt has dx w/ degenerative disk disease.  this is a neuro md Pt has [facility] Pt is going to switch providers, but NP appt w/ dr Fabian Sharppanosh is not until April.

## 2013-03-19 NOTE — Telephone Encounter (Addendum)
This note was accidentally close, sorryy!!  New note statrted w/ same info.

## 2013-03-19 NOTE — Telephone Encounter (Signed)
Pt has been scheduled w/ Dr Fabian SharpPanosh in April for NP appt. Pt aware od psych med management.

## 2013-03-24 NOTE — Telephone Encounter (Signed)
Called and spoke with pt and pt states she saw the orthopedic doctor and she needs a referral for a neurosurgeon in [place].  Pt would like a second opinion.

## 2013-06-02 ENCOUNTER — Ambulatory Visit (INDEPENDENT_AMBULATORY_CARE_PROVIDER_SITE_OTHER): Payer: BC Managed Care – PPO | Admitting: Internal Medicine

## 2013-06-02 ENCOUNTER — Encounter: Payer: Self-pay | Admitting: Internal Medicine

## 2013-06-02 VITALS — BP 126/86 | Temp 98.9°F | Ht 60.25 in | Wt 142.0 lb

## 2013-06-02 DIAGNOSIS — Z79899 Other long term (current) drug therapy: Secondary | ICD-10-CM

## 2013-06-02 DIAGNOSIS — Z1211 Encounter for screening for malignant neoplasm of colon: Secondary | ICD-10-CM

## 2013-06-02 DIAGNOSIS — Z8639 Personal history of other endocrine, nutritional and metabolic disease: Secondary | ICD-10-CM | POA: Insufficient documentation

## 2013-06-02 DIAGNOSIS — F329 Major depressive disorder, single episode, unspecified: Secondary | ICD-10-CM

## 2013-06-02 DIAGNOSIS — E559 Vitamin D deficiency, unspecified: Secondary | ICD-10-CM

## 2013-06-02 DIAGNOSIS — M797 Fibromyalgia: Secondary | ICD-10-CM

## 2013-06-02 DIAGNOSIS — F32A Depression, unspecified: Secondary | ICD-10-CM | POA: Insufficient documentation

## 2013-06-02 DIAGNOSIS — F3289 Other specified depressive episodes: Secondary | ICD-10-CM

## 2013-06-02 DIAGNOSIS — M47812 Spondylosis without myelopathy or radiculopathy, cervical region: Secondary | ICD-10-CM

## 2013-06-02 DIAGNOSIS — M479 Spondylosis, unspecified: Secondary | ICD-10-CM

## 2013-06-02 DIAGNOSIS — IMO0001 Reserved for inherently not codable concepts without codable children: Secondary | ICD-10-CM

## 2013-06-02 DIAGNOSIS — K589 Irritable bowel syndrome without diarrhea: Secondary | ICD-10-CM

## 2013-06-02 LAB — CBC WITH DIFFERENTIAL/PLATELET
Basophils Absolute: 0 10*3/uL (ref 0.0–0.1)
Basophils Relative: 0.3 % (ref 0.0–3.0)
Eosinophils Absolute: 0.2 10*3/uL (ref 0.0–0.7)
Eosinophils Relative: 3.7 % (ref 0.0–5.0)
HCT: 40 % (ref 36.0–46.0)
Hemoglobin: 13.1 g/dL (ref 12.0–15.0)
Lymphocytes Relative: 25.6 % (ref 12.0–46.0)
Lymphs Abs: 1.5 10*3/uL (ref 0.7–4.0)
MCHC: 32.9 g/dL (ref 30.0–36.0)
MCV: 95.5 fl (ref 78.0–100.0)
Monocytes Absolute: 0.5 10*3/uL (ref 0.1–1.0)
Monocytes Relative: 8.8 % (ref 3.0–12.0)
Neutro Abs: 3.5 10*3/uL (ref 1.4–7.7)
Neutrophils Relative %: 61.6 % (ref 43.0–77.0)
Platelets: 245 10*3/uL (ref 150.0–400.0)
RBC: 4.19 Mil/uL (ref 3.87–5.11)
RDW: 14 % (ref 11.5–14.6)
WBC: 5.8 10*3/uL (ref 4.5–10.5)

## 2013-06-02 LAB — TSH: TSH: 1.16 u[IU]/mL (ref 0.35–5.50)

## 2013-06-02 LAB — BASIC METABOLIC PANEL
BUN: 17 mg/dL (ref 6–23)
CO2: 32 mEq/L (ref 19–32)
Calcium: 8.9 mg/dL (ref 8.4–10.5)
Chloride: 101 mEq/L (ref 96–112)
Creatinine, Ser: 0.7 mg/dL (ref 0.4–1.2)
GFR: 92.76 mL/min (ref >60.00)
Glucose, Bld: 91 mg/dL (ref 70–99)
Potassium: 4.1 mEq/L (ref 3.5–5.1)
Sodium: 140 mEq/L (ref 135–145)

## 2013-06-02 LAB — HEPATIC FUNCTION PANEL
ALBUMIN: 3.2 g/dL — AB (ref 3.5–5.2)
ALT: 28 U/L (ref 0–35)
AST: 22 U/L (ref 0–37)
Alkaline Phosphatase: 151 U/L — ABNORMAL HIGH (ref 39–117)
BILIRUBIN DIRECT: 0.1 mg/dL (ref 0.0–0.3)
TOTAL PROTEIN: 6.2 g/dL (ref 6.0–8.3)
Total Bilirubin: 0.6 mg/dL (ref 0.3–1.2)

## 2013-06-02 LAB — T4, FREE: Free T4: 0.64 ng/dL (ref 0.60–1.60)

## 2013-06-02 LAB — LIPID PANEL
CHOL/HDL RATIO: 2
CHOLESTEROL: 194 mg/dL (ref 0–200)
HDL: 104.6 mg/dL (ref >39.00)
LDL Cholesterol: 82 mg/dL (ref 0–99)
TRIGLYCERIDES: 39 mg/dL (ref 0.0–149.0)
VLDL: 7.8 mg/dL (ref 0.0–40.0)

## 2013-06-02 NOTE — Progress Notes (Addendum)
Chief Complaint  Patient presents with  . Establish Care    Needed to establish with a PCP.    HPI: Patient comes in today for transfer care visit visit  She was originally a patient of Dr. Smith MinceMazzocchi years ago and has had different primary care physician since then. 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 She has had a diagnosis of fibromyalgia and has had degenerative changes in her spine and are possible a surgical candidate.  Fibromyalgia was confirmed by rheumatologist Dr. Durenda Ageeveshewar  in the past. She's been on Flexeril and tramadol but these did not help her so she stopped them. Asks for device in regarding her fibromyalgia. Has done some water exercise in the past  And did ok currently not very ractive  Asks for a vitamin D level to do lab tests because it had been very low and she had been on prescription medicine in the past.  Dr  Danella DeisGruber for rosacea. Minocin metrogel. Sometimes.  Dr Evelene CroonKaur for about 6 years.now battling depression mood problems off of cynmbalta and now on less and added wellbutrin . About a year. Still feels fatigued  Has had fibromyalgia   On diasbility since may. 14And due for surgery  To see .  Back and neck.  To see Dr Channing Muttersoy .   In [place].  Has seen dukonsky in past .   Had had PT .   "Has pain everywhere ." When asked  As the most however she does get back pain that will radiating pain down legs.  Tingling.   Adoptive   Parents  Father bladder and  Esophageal cancer   Died a year apart. She was about age 53 or [age]  Biologic parents had  Diabetes type 1 and type 2.   Menopausal  Dieting. To try to lose some weight  Ht:  Put on med in past  But was smoking at that time. Then she is doing better now  IBS? Constipated .   Didn't get her colonscipy . Because of the stress and difficulties in her life with her parents dying etc.   Health Maintenance  Topic Date Due  . Pap Smear  08/27/1977  .  Tetanus/tdap  08/28/1978  . Mammogram  08/27/2009  . Colonoscopy  08/27/2009  . Influenza Vaccine  09/11/2013   Health Maintenance Review   ROS:  GEN/ HEENT: No fever, significant weight changes sweats? No periods for over a year  headaches vision problems hearing changes, CV/ PULM; No chest pain shortness of breath cough, syncope,edema  change in exercise tolerance. GI /GU: No adominal pain, vomiting, change in bowel habits. No blood in the stool. No significant GU symptoms. SKIN/HEME: ,no acute skin rashes suspicious lesions or bleeding. No lymphadenopathy, nodules, masses.  NEURO/ PSYCH:   As per hpi  IMM/ Allergy: No unusual infections.  Allergy .   REST of 12 system review negative except as per HPI   Past Medical History  Diagnosis Date  . Depression   . GERD (gastroesophageal reflux disease)   . Hypertension   . IBS (irritable bowel syndrome)   . Fibromyalgia   . Asthma   . Headache(784.0)     frequent  . Seasonal allergies   . Heart murmur   . Migraines   . Positive TB test   . Hiatal hernia   . Anxiety     Family History  Problem Relation Age of Onset  .  Adopted: Yes  . Mental illness      parent  . Diabetes      parent     History   Social History  . Marital Status: Divorced    Spouse Name: N/A    Number of Children: N/A  . Years of Education: N/A   Occupational History  . RN [employer]   Social History Main Topics  . Smoking status: Former Smoker -- 1 years    Types: Cigarettes  . Smokeless tobacco: Never Used  . Alcohol Use: Yes     Comment: 2-3/wk  . Drug Use: No  . Sexual Activity: No   Other Topics Concern  . None   Social History Narrative   Work or School: Engineer, civil (consulting) for [employer]      Home Situation: lives with daughter      Spiritual Beliefs: christian   Lifestyle: no regular exercise, diet ok   1 dog  hh of 1    Divorced  For 12 years     Not working outside  Of home . Had been working as a Engineer, civil (consulting) for [employer]  [employer] but has been on disability since May of 2014   Has 3 healthy children   Originally from [place] has been in [place] area for over 35 years.   Both adopted parents died in the same year recently esophageal cancer and bladder cancer.   etoh 1-3 hs                          Outpatient Encounter Prescriptions as of 06/02/2013  Medication Sig  . acetaminophen (TYLENOL) 500 MG tablet Take 500 mg by mouth every 6 (six) hours as needed for pain.  Marland Kitchen albuterol (PROVENTIL HFA;VENTOLIN HFA) 108 (90 BASE) MCG/ACT inhaler Inhale 2 puffs into the lungs every 6 (six) hours as needed.  Marland Kitchen buPROPion (WELLBUTRIN SR) 150 MG 12 hr tablet Take 150 mg by mouth daily.  Marland Kitchen docusate sodium (COLACE) 100 MG capsule Take 100 mg by mouth daily.  . DULoxetine (CYMBALTA) 60 MG capsule Take 120 mg by mouth daily.  . minocycline (MINOCIN,DYNACIN) 100 MG capsule Take 100 mg by mouth daily.  . naproxen sodium (ANAPROX) 220 MG tablet Taking 2 in the morning and 2 at night  . omeprazole (PRILOSEC) 40 MG capsule Take 1 capsule (40 mg total) by mouth daily.  . polyethylene glycol (MIRALAX / GLYCOLAX) packet Take 17 g by mouth daily. Use twice a day until having loose stools, then back off to just once a day.  Marland Kitchen QUEtiapine (SEROQUEL XR) 300 MG 24 hr tablet Take 300 mg by mouth at bedtime.  . [DISCONTINUED] cyclobenzaprine (FLEXERIL) 10 MG tablet Take 1 tablet (10 mg total) by mouth 3 (three) times daily as needed for muscle spasms.  . [DISCONTINUED] meloxicam (MOBIC) 15 MG tablet Take 1 tablet (15 mg total) by mouth daily.  . [DISCONTINUED] ondansetron (ZOFRAN ODT) 8 MG disintegrating tablet Take 1 tablet (8 mg total) by mouth every 8 (eight) hours as needed for nausea.  . [DISCONTINUED] predniSONE (DELTASONE) 20 MG tablet Take 3 PO QAM x3days, 2 PO QAM x3days, 1 PO QAM x3days  . [DISCONTINUED] traMADol (ULTRAM) 50 MG tablet Take 1-2 tablets (50-100 mg total) by mouth every 8 (eight) hours as needed for  pain.    EXAM:  BP 126/86  Temp(Src) 98.9 F (37.2 C) (Oral)  Ht 5' 0.25" (1.53 m)  Wt 142 lb (64.411  kg)  BMI 27.52 kg/m2  LMP 01/12/2011  Body mass index is 27.52 kg/(m^2).  Physical Exam: Vital signs reviewed XBJ:YNWGGEN:This is a well-developed well-nourished alert cooperative    who appearsr stated age in no acute distress.  Somewhat flat affect  Cognition intact  HEENT: normocephalic atraumatic , Eyes: PERRL EOM's full, conjunctiva clear, Nares: paten,t no deformity discharge or tenderness., Ears: no deformity EAC's clear TMs with normal landmarks. Mouth: clear OP, no lesions, edema.  Moist mucous membranes. Dentition in adequate repair. NECK: supple without masses, thyromegaly or bruits. CHEST/PULM:  Clear to auscultation and percussion breath sounds equal no wheeze , rales or rhonchi.CV: PMI is nondisplaced, S1 S2 no gallops, murmurs, rubs. Peripheral pulses are full without delay.No JVD .  ABDOMEN: Bowel sounds normal nontender  No guard or rebound, no hepato splenomegal no CVA tenderness.  Extremtities:  No clubbing cyanosis or edema, no acute joint swelling or redness no focal atrophy NEURO:  Oriented x3, cranial nerves 3-12 appear to be intact, no obvious focal weakness,SKIN: No acute rashes normal turgor, color, no bruising or petechiae. PSYCH: Oriented, good eye contact,  cognition and judgment appear normal. LN: no cervical  adenopathy  Lab Results  Component Value Date   WBC 5.8 06/02/2013   HGB 13.1 06/02/2013   HCT 40.0 06/02/2013   PLT 245.0 06/02/2013   GLUCOSE 91 06/02/2013   CHOL 194 06/02/2013   TRIG 39.0 06/02/2013   HDL 104.60 06/02/2013   LDLDIRECT 102.1 04/09/2012   LDLCALC 82 06/02/2013   ALT 28 06/02/2013   AST 22 06/02/2013   NA 140 06/02/2013   K 4.1 06/02/2013   CL 101 06/02/2013   CREATININE 0.7 06/02/2013   BUN 17 06/02/2013   CO2 32 06/02/2013   TSH 1.16 06/02/2013   INR 0.9 10/01/2008   HGBA1C 5.2 04/09/2012    ASSESSMENT AND PLAN:  Discussed the following  assessment and plan:  Fibromyalgia - Plan: Basic metabolic panel, CBC with Differential, Hepatic function panel, Lipid panel, TSH, T4, free, Vit D  25 hydroxy (rtn osteoporosis monitoring)  Unspecified vitamin D deficiency - Plan: Basic metabolic panel, CBC with Differential, Hepatic function panel, Lipid panel, TSH, T4, free, Vit D  25 hydroxy (rtn osteoporosis monitoring)  Medication management - Plan: Basic metabolic panel, CBC with Differential, Hepatic function panel, Lipid panel, TSH, T4, free, Vit D  25 hydroxy (rtn osteoporosis monitoring)  IBS (irritable bowel syndrome) - Plan: Basic metabolic panel, CBC with Differential, Hepatic function panel, Lipid panel, TSH, T4, free, Vit D  25 hydroxy (rtn osteoporosis monitoring), Ambulatory referral to Gastroenterology  Degenerative joint disease of cervical spine - Plan: Basic metabolic panel, CBC with Differential, Hepatic function panel, Lipid panel, TSH, T4, free, Vit D  25 hydroxy (rtn osteoporosis monitoring)  Degenerative joint disease of low back - Plan: Basic metabolic panel, CBC with Differential, Hepatic function panel, Lipid panel, TSH, T4, free, Vit D  25 hydroxy (rtn osteoporosis monitoring)  Depression - under specilaty care   Colon cancer screening - Plan: Ambulatory referral to Gastroenterology She is overdue for a colonoscopy and has been battling some GI symptoms. Will refer to GI about her symptoms and would need a colonoscopy eventually. Some of this could be medication effect. Uncertain what we can do about the fibromyalgia but did encourage her to stay physically active and some type of exercise program to avoid deconditioning. Would follow her up after evaluation by the neurosurgeon Dr. Channing Muttersoy. Uncertain if she would do better in a pain  clinic or in exercise program. She is motivated to try healthy things. Laboratory test done today for medication monitoring. Reassess prevnetive parameters in future   Overdue for  mammo. Disc limiting the etoh.  Patient Care Team: Madelin Headings, MD as PCP - General (Internal Medicine) Rupinder Stevan Born, MD as Consulting Physician (Psychiatry) [facility] Bjorn Loser, MD as Consulting Physician (Dermatology) Patient Instructions  See dr Channing Mutters first about the spine disease and plan ? If you may or may not be helped by pain clinic. Consideration .  Of  gabapentin and or lyrica .  ROV after seeing dr Channing Mutters .  with plan.      Neta Mends. Panosh M.D.  Pre visit review using our clinic review tool, if applicable. No additional management support is needed unless otherwise documented below in the visit note. Total visit > 50% spent counseling and coordinating care

## 2013-06-02 NOTE — Patient Instructions (Signed)
See dr Channing Muttersoy first about the spine disease and plan ? If you may or may not be helped by pain clinic. Consideration .  Of  gabapentin and or lyrica .  ROV after seeing dr Channing MuttersOY .  with plan.

## 2013-06-03 LAB — VITAMIN D 25 HYDROXY (VIT D DEFICIENCY, FRACTURES): VIT D 25 HYDROXY: 44 ng/mL (ref 30–89)

## 2013-06-08 ENCOUNTER — Other Ambulatory Visit: Payer: Self-pay | Admitting: Family Medicine

## 2013-06-11 ENCOUNTER — Other Ambulatory Visit: Payer: Self-pay | Admitting: *Deleted

## 2013-06-11 ENCOUNTER — Other Ambulatory Visit (INDEPENDENT_AMBULATORY_CARE_PROVIDER_SITE_OTHER): Payer: BC Managed Care – PPO

## 2013-06-11 ENCOUNTER — Other Ambulatory Visit: Payer: Self-pay | Admitting: Family Medicine

## 2013-06-11 DIAGNOSIS — R748 Abnormal levels of other serum enzymes: Secondary | ICD-10-CM

## 2013-06-11 LAB — HEPATIC FUNCTION PANEL
ALT: 54 U/L — ABNORMAL HIGH (ref 0–35)
AST: 39 U/L — AB (ref 0–37)
Albumin: 3.2 g/dL — ABNORMAL LOW (ref 3.5–5.2)
Alkaline Phosphatase: 137 U/L — ABNORMAL HIGH (ref 39–117)
BILIRUBIN DIRECT: 0 mg/dL (ref 0.0–0.3)
BILIRUBIN TOTAL: 0.5 mg/dL (ref 0.3–1.2)
Total Protein: 6 g/dL (ref 6.0–8.3)

## 2013-06-11 LAB — GAMMA GT: GGT: 51 U/L (ref 7–51)

## 2013-06-16 LAB — NUCLEOTIDASE, 5', BLOOD: 5-Nucleotidase: 5 U/L (ref <11)

## 2013-06-25 ENCOUNTER — Telehealth: Payer: Self-pay | Admitting: Family Medicine

## 2013-06-25 NOTE — Telephone Encounter (Signed)
Spoke to the pt and gave her the lab results.  She was suppose to make a follow up appt after seeing neuro.  She has seen them twice and has a bone density screen coming up along with another test that she couldn't remember.  Wanted to know if she should wait and have testing, get results from neuro and then come in or just go ahead and make an appt.  Also, would like to new prescription for a muscle relaxer and anxiety medication due to fibromyalgia.  Please advise.  Thanks!

## 2013-06-25 NOTE — Telephone Encounter (Signed)
I  agree waiting until after evaluation ? I  don't know what meds we are talking about  ? Cymbalta is  used for anxiety and FM

## 2013-06-28 NOTE — Telephone Encounter (Signed)
Spoke to the pt.  She would like to try lorazepam and Flexeril in addition to Cymbalta.  Continues to have pain and discomfort.  She mentioned that Dr. Fabian SharpPanosh has also talked about pain management in the past.  Please advise.  Thanks!

## 2013-07-02 ENCOUNTER — Encounter (INDEPENDENT_AMBULATORY_CARE_PROVIDER_SITE_OTHER): Payer: Self-pay

## 2013-07-02 ENCOUNTER — Other Ambulatory Visit: Payer: Self-pay | Admitting: Neurosurgery

## 2013-07-02 ENCOUNTER — Ambulatory Visit
Admission: RE | Admit: 2013-07-02 | Discharge: 2013-07-02 | Disposition: A | Discharge status: Home / Self Care | Payer: BC Managed Care – PPO | Source: Ambulatory Visit | Attending: Neurosurgery | Admitting: Neurosurgery

## 2013-07-02 DIAGNOSIS — M47816 Spondylosis without myelopathy or radiculopathy, lumbar region: Secondary | ICD-10-CM

## 2013-07-06 ENCOUNTER — Encounter: Payer: Self-pay | Admitting: Internal Medicine

## 2013-08-13 ENCOUNTER — Other Ambulatory Visit: Payer: Self-pay | Admitting: Family Medicine

## 2013-08-23 NOTE — Telephone Encounter (Signed)
Pt is calling to check the status of the below re-fill °

## 2013-08-24 NOTE — Telephone Encounter (Signed)
Ok to refill x 6 months 

## 2013-08-25 NOTE — Telephone Encounter (Signed)
Sent to the pharmacy for e-scribe.

## 2013-08-27 ENCOUNTER — Other Ambulatory Visit: Payer: Self-pay | Admitting: Neurosurgery

## 2013-08-27 DIAGNOSIS — E2839 Other primary ovarian failure: Secondary | ICD-10-CM

## 2013-09-03 ENCOUNTER — Ambulatory Visit
Admission: RE | Admit: 2013-09-03 | Discharge: 2013-09-03 | Disposition: A | Discharge status: Home / Self Care | Payer: BC Managed Care – PPO | Source: Ambulatory Visit | Attending: Neurosurgery | Admitting: Neurosurgery

## 2013-09-03 ENCOUNTER — Encounter (INDEPENDENT_AMBULATORY_CARE_PROVIDER_SITE_OTHER): Payer: Self-pay

## 2013-09-03 DIAGNOSIS — E2839 Other primary ovarian failure: Secondary | ICD-10-CM

## 2014-03-02 ENCOUNTER — Other Ambulatory Visit: Payer: Self-pay | Admitting: Internal Medicine

## 2014-03-03 NOTE — Telephone Encounter (Signed)
Not sure when pt should return.  [facility]

## 2014-03-06 NOTE — Telephone Encounter (Signed)
Can refill x 2 months  Schedule  appt to be completed before  refills  run out .

## 2014-03-07 ENCOUNTER — Telehealth: Payer: Self-pay | Admitting: Family Medicine

## 2014-03-07 ENCOUNTER — Ambulatory Visit: Payer: Self-pay | Admitting: Internal Medicine

## 2014-03-07 NOTE — Telephone Encounter (Signed)
Sent to the pharmacy by e-scribe.  Will send a message to scheduling to help get her on the schedule.

## 2014-03-07 NOTE — Telephone Encounter (Signed)
lmom for pt to ssch appt

## 2014-03-07 NOTE — Telephone Encounter (Signed)
Pt needs to be [facility] in the next 60 days [facility]  I have refilled her omeprazole for 60 days.  Please help the pt to get on the schedule before she runs out of medication.  Thanks!

## 2014-03-09 NOTE — Telephone Encounter (Signed)
lmom for pt to sch appt °

## 2014-03-11 NOTE — Telephone Encounter (Signed)
lmom for pt to sch appt °

## 2014-04-05 ENCOUNTER — Encounter: Payer: Self-pay | Admitting: Internal Medicine

## 2014-04-05 ENCOUNTER — Ambulatory Visit (INDEPENDENT_AMBULATORY_CARE_PROVIDER_SITE_OTHER): Payer: BLUE CROSS/BLUE SHIELD | Admitting: Internal Medicine

## 2014-04-05 VITALS — BP 116/80 | Temp 98.3°F | Ht 60.5 in | Wt 150.0 lb

## 2014-04-05 DIAGNOSIS — Z79899 Other long term (current) drug therapy: Secondary | ICD-10-CM

## 2014-04-05 DIAGNOSIS — M859 Disorder of bone density and structure, unspecified: Secondary | ICD-10-CM

## 2014-04-05 DIAGNOSIS — M8580 Other specified disorders of bone density and structure, unspecified site: Secondary | ICD-10-CM

## 2014-04-05 DIAGNOSIS — R945 Abnormal results of liver function studies: Secondary | ICD-10-CM

## 2014-04-05 DIAGNOSIS — K219 Gastro-esophageal reflux disease without esophagitis: Secondary | ICD-10-CM

## 2014-04-05 DIAGNOSIS — Z2821 Immunization not carried out because of patient refusal: Secondary | ICD-10-CM | POA: Insufficient documentation

## 2014-04-05 DIAGNOSIS — K449 Diaphragmatic hernia without obstruction or gangrene: Secondary | ICD-10-CM | POA: Insufficient documentation

## 2014-04-05 DIAGNOSIS — M797 Fibromyalgia: Secondary | ICD-10-CM

## 2014-04-05 DIAGNOSIS — M5030 Other cervical disc degeneration, unspecified cervical region: Secondary | ICD-10-CM

## 2014-04-05 DIAGNOSIS — R7989 Other specified abnormal findings of blood chemistry: Secondary | ICD-10-CM

## 2014-04-05 NOTE — Patient Instructions (Addendum)
Try wean  the prilosec    To prevent rebound   Decrease to 20 mg  And can try every other day .   Change over to zantac 150 mg twice a day  Days not on prilosec and then every day eventually . antireflux diet . 5 # weight loss can help the reflux  Also   Plan fasting labs  To check sugar liver kidney etc. And will send result to dr Evelene CroonKaur.   Food Choices for Gastroesophageal Reflux Disease When you have gastroesophageal reflux disease (GERD), the foods you eat and your eating habits are very important. Choosing the right foods can help ease the discomfort of GERD. WHAT GENERAL GUIDELINES DO I NEED TO FOLLOW?  Choose fruits, vegetables, whole grains, low-fat dairy products, and low-fat meat, fish, and poultry.  Limit fats such as oils, salad dressings, butter, nuts, and avocado.  Keep a food diary to identify foods that cause symptoms.  Avoid foods that cause reflux. These may be different for different people.  Eat frequent small meals instead of three large meals each day.  Eat your meals slowly, in a relaxed setting.  Limit fried foods.  Cook foods using methods other than frying.  Avoid drinking alcohol.  Avoid drinking large amounts of liquids with your meals.  Avoid bending over or lying down until 2-3 hours after eating. WHAT FOODS ARE NOT RECOMMENDED? The following are some foods and drinks that may worsen your symptoms: Vegetables Tomatoes. Tomato juice. Tomato and spaghetti sauce. Chili peppers. Onion and garlic. Horseradish. Fruits Oranges, grapefruit, and lemon (fruit and juice). Meats High-fat meats, fish, and poultry. This includes hot dogs, ribs, ham, sausage, salami, and bacon. Dairy Whole milk and chocolate milk. Sour cream. Cream. Butter. Ice cream. Cream cheese.  Beverages Coffee and tea, with or without caffeine. Carbonated beverages or energy drinks. Condiments Hot sauce. Barbecue sauce.  Sweets/Desserts Chocolate and cocoa. Donuts. Peppermint and  spearmint. Fats and Oils High-fat foods, including [place] fries and potato chips. Other Vinegar. Strong spices, such as black pepper, white pepper, red pepper, cayenne, curry powder, cloves, ginger, and chili powder. The items listed above may not be a complete list of foods and beverages to avoid. Contact your dietitian for more information. Document Released: 01/28/2005 Document Revised: 02/02/2013 Document Reviewed: 12/02/2012 [facility] Patient Information 2015 [employer] [place]. This information is not intended to replace advice given to you by your health care provider. Make sure you discuss any questions you have with your health care provider.   Get a colonoscopy and mammogram when you can  . Contact us if need help  Have dr Channing Muttersoy send us copy of next encounters for continuity of care .

## 2014-04-05 NOTE — Progress Notes (Signed)
Pre visit review using our clinic review tool, if applicable. No additional management support is needed unless otherwise documented below in the visit note.  Chief Complaint  Patient presents with  . Follow-up    HPI: [name]  54 y.o. last seen almost a year ago  For first time with hx fo djd spine Fm depression and ibs  Takes nsaid and is on ppi  Taking nsaid as needed.  About 5 days per week. Trying to stop the ppi cause of [facility] per dr Channing Muttersoy  stopped the prilosec.  fro drug interaction about a week ago and to use zantac HH . Has seen dr Channing Muttersoy and gaver her metazalone  And did another x fray and bone density and was low  And gave reclast   With some help.  And to get another  One this spring .  ROS: See pertinent positives and negatives per HPI.  Past Medical History  Diagnosis Date  . Depression   . GERD (gastroesophageal reflux disease)   . Hypertension   . IBS (irritable bowel syndrome)   . Fibromyalgia   . Asthma   . Headache(784.0)     frequent  . Seasonal allergies   . Heart murmur   . Migraines   . Positive TB test   . Hiatal hernia   . Anxiety     Family History  Problem Relation Age of Onset  . Adopted: Yes  . Mental illness      parent  . Diabetes      parent     History   Social History  . Marital Status: Divorced    Spouse Name: N/A  . Number of Children: N/A  . Years of Education: N/A   Occupational History  . RN [employer]   Social History Main Topics  . Smoking status: Former Smoker -- 1 years    Types: Cigarettes  . Smokeless tobacco: Never Used  . Alcohol Use: Yes     Comment: 2-3/wk  . Drug Use: No  . Sexual Activity: No   Other Topics Concern  . Not on file   Social History Narrative   Work or School: Engineer, civil (consulting)nurse for [employer]      Home Situation: lives with daughter      Spiritual Beliefs: christian   Lifestyle: no regular exercise, diet ok   1 dog  hh of 1    Divorced  For 12 years     Not working  outside  Of home . 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   Both adopted parents died in the same year recently esophageal cancer and bladder cancer.   etoh 1-3 hs                          Outpatient Encounter Prescriptions as of 04/05/2014  Medication Sig  . albuterol (PROVENTIL HFA;VENTOLIN HFA) 108 (90 BASE) MCG/ACT inhaler Inhale 2 puffs into the lungs every 6 (six) hours as needed.  Marland Kitchen. buPROPion (WELLBUTRIN SR) 150 MG 12 hr tablet Take 150 mg by mouth daily.  . Calcium Carbonate-Vitamin D (CALCIUM + D PO) Take by mouth.  . Cholecalciferol (VITAMIN D-3 PO) Take by mouth.  . DULoxetine (CYMBALTA) 60 MG capsule Take 120 mg by  mouth daily.  Marland Kitchen gabapentin (NEURONTIN) 600 MG tablet 1 tablet six times daily  . Magnesium 500 MG TABS Take by mouth.  . metaxalone (SKELAXIN) 800 MG tablet Take 800 mg by mouth 3 (three) times daily.  . naproxen sodium (ANAPROX) 220 MG tablet Taking 2 in the morning and 2 at night  . QUEtiapine (SEROQUEL XR) 300 MG 24 hr tablet Take 300 mg by mouth at bedtime.  . ranitidine (ZANTAC) 150 MG tablet Take 150 mg by mouth 2 (two) times daily.  . [DISCONTINUED] acetaminophen (TYLENOL) 500 MG tablet Take 500 mg by mouth every 6 (six) hours as needed for pain.  . [DISCONTINUED] docusate sodium (COLACE) 100 MG capsule Take 100 mg by mouth daily.  . [DISCONTINUED] minocycline (MINOCIN,DYNACIN) 100 MG capsule Take 100 mg by mouth daily.  . [DISCONTINUED] omeprazole (PRILOSEC) 40 MG capsule TAKE 1 CAPSULE (40 MG TOTAL) BY MOUTH DAILY.  . [DISCONTINUED] polyethylene glycol (MIRALAX / GLYCOLAX) packet Take 17 g by mouth daily. Use twice a day until having loose stools, then back off to just once a day.    EXAM:  BP 116/80 mmHg  Temp(Src) 98.3 F (36.8 C) (Oral)  Ht 5' 0.5" (1.537 m)  Wt 150 lb (68.04 kg)   BMI 28.80 kg/m2  LMP 01/12/2011  Body mass index is 28.8 kg/(m^2).  GENERAL: vitals reviewed and listed above, alert, oriented, appears well hydrated and in no acute distress HEENT: atraumatic, conjunctiva  clear, no obvious abnormalities on inspection of external nose and ears O NECK: no obvious masses on inspection palpation  LUNGS: clear to auscultation bilaterally, no wheezes, rales or rhonchi, good air movement CV: HRRR, no clubbing cyanosis or  peripheral edema nl cap refill  MS: moves all extremities without noticeable focal  abnormality PSYCH: pleasant and cooperative, no obvious depression or anxiety Lab Results  Component Value Date   WBC 5.8 06/02/2013   HGB 13.1 06/02/2013   HCT 40.0 06/02/2013   PLT 245.0 06/02/2013   GLUCOSE 91 06/02/2013   CHOL 194 06/02/2013   TRIG 39.0 06/02/2013   HDL 104.60 06/02/2013   LDLDIRECT 102.1 04/09/2012   LDLCALC 82 06/02/2013   ALT 54* 06/11/2013   AST 39* 06/11/2013   NA 140 06/02/2013   K 4.1 06/02/2013   CL 101 06/02/2013   CREATININE 0.7 06/02/2013   BUN 17 06/02/2013   CO2 32 06/02/2013   TSH 1.16 06/02/2013   INR 0.9 10/01/2008   HGBA1C 5.2 04/09/2012    ASSESSMENT AND PLAN:  Discussed the following assessment and plan:  Gastroesophageal reflux disease, esophagitis presence not specified - Plan: Basic metabolic panel, CBC with Differential/Platelet, Hepatic function panel, Lipid panel, TSH  Hiatal hernia - Plan: Basic metabolic panel, CBC with Differential/Platelet, Hepatic function panel, Lipid panel, TSH  Abnormal LFTs - repeat with labs  - Plan: Basic metabolic panel, CBC with Differential/Platelet, Hepatic function panel, Lipid panel, TSH  Medication management - due fo lab monitoring will send results also to dr Evelene Croon - Plan: Basic metabolic panel, CBC with Differential/Platelet, Hepatic function panel, Lipid panel, TSH  Degenerative disc disease, cervical - per dr Channing Mutters - Plan: Basic metabolic panel, CBC with  Differential/Platelet, Hepatic function panel, Lipid panel, TSH  Fibromyalgia - improved on musc relaxant given by dr Channing Mutters - Plan: Basic metabolic panel, CBC with Differential/Platelet, Hepatic function panel, Lipid panel, TSH  Low bone density - per dr Channing Mutters  getting reclast   Influenza vaccination declined Disc how to wean ppi and other  intervenions and transition to ranitidine  Risk benefit of medication discussed. bnefor  Order fasting labs and i will put in orders  Plan labs to dr K and health team -Patient advised to return or notify health care team  if symptoms worsen ,persist or new concerns arise.  Patient Instructions  Try wean  the prilosec    To prevent rebound   Decrease to 20 mg  And can try every other day .   Change over to zantac 150 mg twice a day  Days not on prilosec and then every day eventually . antireflux diet . 5 # weight loss can help the reflux  Also   Plan fasting labs  To check sugar liver kidney etc. And will send result to dr Evelene Croon.   Food Choices for Gastroesophageal Reflux Disease When you have gastroesophageal reflux disease (GERD), the foods you eat and your eating habits are very important. Choosing the right foods can help ease the discomfort of GERD. WHAT GENERAL GUIDELINES DO I NEED TO FOLLOW?  Choose fruits, vegetables, whole grains, low-fat dairy products, and low-fat meat, fish, and poultry.  Limit fats such as oils, salad dressings, butter, nuts, and avocado.  Keep a food diary to identify foods that cause symptoms.  Avoid foods that cause reflux. These may be different for different people.  Eat frequent small meals instead of three large meals each day.  Eat your meals slowly, in a relaxed setting.  Limit fried foods.  Cook foods using methods other than frying.  Avoid drinking alcohol.  Avoid drinking large amounts of liquids with your meals.  Avoid bending over or lying down until 2-3 hours after eating. WHAT FOODS ARE NOT  RECOMMENDED? The following are some foods and drinks that may worsen your symptoms: Vegetables Tomatoes. Tomato juice. Tomato and spaghetti sauce. Chili peppers. Onion and garlic. Horseradish. Fruits Oranges, grapefruit, and lemon (fruit and juice). Meats High-fat meats, fish, and poultry. This includes hot dogs, ribs, ham, sausage, salami, and bacon. Dairy Whole milk and chocolate milk. Sour cream. Cream. Butter. Ice cream. Cream cheese.  Beverages Coffee and tea, with or without caffeine. Carbonated beverages or energy drinks. Condiments Hot sauce. Barbecue sauce.  Sweets/Desserts Chocolate and cocoa. Donuts. Peppermint and spearmint. Fats and Oils High-fat foods, including [place] fries and potato chips. Other Vinegar. Strong spices, such as black pepper, white pepper, red pepper, cayenne, curry powder, cloves, ginger, and chili powder. The items listed above may not be a complete list of foods and beverages to avoid. Contact your dietitian for more information. Document Released: 01/28/2005 Document Revised: 02/02/2013 Document Reviewed: 12/02/2012 [facility] Patient Information 2015 [place], [place]. This information is not intended to replace advice given to you by your health care provider. Make sure you discuss any questions you have with your health care provider.   Get a colonoscopy and mammogram when you can  . Contact us if need help  Have dr Channing Mutters send [place] copy of next encounters for continuity of care .     Neta Mends. Ercie Eliasen M.D.

## 2014-04-12 ENCOUNTER — Other Ambulatory Visit (INDEPENDENT_AMBULATORY_CARE_PROVIDER_SITE_OTHER): Payer: BLUE CROSS/BLUE SHIELD

## 2014-04-12 DIAGNOSIS — K449 Diaphragmatic hernia without obstruction or gangrene: Secondary | ICD-10-CM

## 2014-04-12 DIAGNOSIS — K219 Gastro-esophageal reflux disease without esophagitis: Secondary | ICD-10-CM

## 2014-04-12 DIAGNOSIS — R7989 Other specified abnormal findings of blood chemistry: Secondary | ICD-10-CM

## 2014-04-12 DIAGNOSIS — R945 Abnormal results of liver function studies: Secondary | ICD-10-CM

## 2014-04-12 DIAGNOSIS — Z79899 Other long term (current) drug therapy: Secondary | ICD-10-CM

## 2014-04-12 DIAGNOSIS — M797 Fibromyalgia: Secondary | ICD-10-CM

## 2014-04-12 DIAGNOSIS — M5030 Other cervical disc degeneration, unspecified cervical region: Secondary | ICD-10-CM

## 2014-04-12 LAB — LIPID PANEL
CHOL/HDL RATIO: 3
Cholesterol: 217 mg/dL — ABNORMAL HIGH (ref 0–200)
HDL: 78.6 mg/dL (ref >39.00)
LDL Cholesterol: 125 mg/dL — ABNORMAL HIGH (ref 0–99)
NonHDL: 138.4
TRIGLYCERIDES: 66 mg/dL (ref 0.0–149.0)
VLDL: 13.2 mg/dL (ref 0.0–40.0)

## 2014-04-12 LAB — HEPATIC FUNCTION PANEL
ALK PHOS: 102 U/L (ref 39–117)
ALT: 9 U/L (ref 0–35)
AST: 12 U/L (ref 0–37)
Albumin: 3.9 g/dL (ref 3.5–5.2)
BILIRUBIN TOTAL: 0.3 mg/dL (ref 0.2–1.2)
Bilirubin, Direct: 0.1 mg/dL (ref 0.0–0.3)
Total Protein: 7.1 g/dL (ref 6.0–8.3)

## 2014-04-12 LAB — CBC WITH DIFFERENTIAL/PLATELET
Basophils Absolute: 0 10*3/uL (ref 0.0–0.1)
Basophils Relative: 0.4 % (ref 0.0–3.0)
EOS ABS: 0.3 10*3/uL (ref 0.0–0.7)
Eosinophils Relative: 4.3 % (ref 0.0–5.0)
HCT: 36.7 % (ref 36.0–46.0)
HEMOGLOBIN: 12.1 g/dL (ref 12.0–15.0)
LYMPHS ABS: 1.5 10*3/uL (ref 0.7–4.0)
Lymphocytes Relative: 23.5 % (ref 12.0–46.0)
MCHC: 32.9 g/dL (ref 30.0–36.0)
MCV: 91.8 fl (ref 78.0–100.0)
MONO ABS: 0.5 10*3/uL (ref 0.1–1.0)
Monocytes Relative: 7.7 % (ref 3.0–12.0)
Neutro Abs: 4 10*3/uL (ref 1.4–7.7)
Neutrophils Relative %: 64.1 % (ref 43.0–77.0)
Platelets: 275 10*3/uL (ref 150.0–400.0)
RBC: 4 Mil/uL (ref 3.87–5.11)
RDW: 13.9 % (ref 11.5–15.5)
WBC: 6.3 10*3/uL (ref 4.0–10.5)

## 2014-04-12 LAB — BASIC METABOLIC PANEL
BUN: 14 mg/dL (ref 6–23)
CHLORIDE: 105 meq/L (ref 96–112)
CO2: 33 meq/L — AB (ref 19–32)
CREATININE: 0.74 mg/dL (ref 0.40–1.20)
Calcium: 9.4 mg/dL (ref 8.4–10.5)
GFR: 86.72 mL/min (ref >60.00)
Glucose, Bld: 104 mg/dL — ABNORMAL HIGH (ref 70–99)
POTASSIUM: 4.8 meq/L (ref 3.5–5.1)
Sodium: 143 mEq/L (ref 135–145)

## 2014-04-12 LAB — TSH: TSH: 3.1 u[IU]/mL (ref 0.35–4.50)

## 2014-05-10 ENCOUNTER — Other Ambulatory Visit: Payer: Self-pay | Admitting: Internal Medicine

## 2014-05-11 NOTE — Telephone Encounter (Signed)
Denied.  Per last OV note, pt is now using Zantac due to side effect with omeprazole.

## 2014-06-04 ENCOUNTER — Other Ambulatory Visit: Payer: Self-pay | Admitting: Family Medicine

## 2014-06-06 ENCOUNTER — Telehealth: Payer: Self-pay | Admitting: Family Medicine

## 2014-06-06 ENCOUNTER — Telehealth: Payer: Self-pay | Admitting: Internal Medicine

## 2014-06-06 NOTE — Telephone Encounter (Signed)
Sent to the pharmacy by e-scribe.  Message sent to scheduling to help her get CPX appt.  Lab work has been completed.

## 2014-06-06 NOTE — Telephone Encounter (Signed)
Pt  called to ask for a referral to see someone about her hernia.

## 2014-06-06 NOTE — Telephone Encounter (Signed)
WP would like to see back in the office for CPX in 3 months.  Please help her make that appt.  Labs already done.  Thanks!

## 2014-06-06 NOTE — Telephone Encounter (Signed)
Ok to   [facility]  Refill x 2  Have her schedule   Preventive visit (  No labs ahead of time ) before runs out.

## 2014-06-07 NOTE — Telephone Encounter (Signed)
lmovm to cb and schedule appt

## 2014-06-07 NOTE — Telephone Encounter (Signed)
Pt states md is aware she has an hernia

## 2014-06-07 NOTE — Telephone Encounter (Signed)
Please call pt to schedule OV per Dr. Rosezella FloridaPanosh's note

## 2014-06-07 NOTE — Telephone Encounter (Signed)
Not enough information . Advise Ov .

## 2014-06-07 NOTE — Telephone Encounter (Signed)
Pt has been sch

## 2014-06-08 NOTE — Telephone Encounter (Signed)
Patient called back and is scheduled

## 2014-06-09 ENCOUNTER — Ambulatory Visit (INDEPENDENT_AMBULATORY_CARE_PROVIDER_SITE_OTHER): Payer: BLUE CROSS/BLUE SHIELD | Admitting: Internal Medicine

## 2014-06-09 ENCOUNTER — Encounter: Payer: Self-pay | Admitting: Gastroenterology

## 2014-06-09 VITALS — BP 116/80 | Temp 98.1°F | Ht 60.25 in | Wt 149.7 lb

## 2014-06-09 DIAGNOSIS — K458 Other specified abdominal hernia without obstruction or gangrene: Secondary | ICD-10-CM

## 2014-06-09 DIAGNOSIS — K219 Gastro-esophageal reflux disease without esophagitis: Secondary | ICD-10-CM

## 2014-06-09 DIAGNOSIS — K469 Unspecified abdominal hernia without obstruction or gangrene: Secondary | ICD-10-CM | POA: Diagnosis not present

## 2014-06-09 DIAGNOSIS — K449 Diaphragmatic hernia without obstruction or gangrene: Secondary | ICD-10-CM | POA: Diagnosis not present

## 2014-06-09 MED ORDER — PANTOPRAZOLE SODIUM 40 MG PO TBEC: 40.0000 mg | DELAYED_RELEASE_TABLET | Freq: Every day | ORAL | Status: DC

## 2014-06-09 NOTE — Progress Notes (Signed)
Pre visit review using our clinic review tool, if applicable. No additional management support is needed unless otherwise documented below in the visit note.  Chief Complaint  Patient presents with  . Hiatal Hernia    Painful.  Requesting referral.    HPI: [name] 54 y.o. comes in for referral for "henria": see phone note   Has dx sx of gerd  See last note 2 /16 was trying to wean the  ppi  Having burning  lhigh epigastrum despite  raniti bid  Some reflux  Also :Went to dr Benna Dunks for a tummy tuck  evaluationand looked at abdomen  outpouching   And wanted to check for ventral hernia.  Hx of dr Christ Kick .  Eval? Has had outpouching since 3 children pregnancies  But ? If owrse  Will need to lose weight also  ROS: See pertinent positives and negatives per HPI. No vomiting no blood  No weight loss unintended  Past Medical History  Diagnosis Date  . Depression   . GERD (gastroesophageal reflux disease)   . Hypertension   . IBS (irritable bowel syndrome)   . Fibromyalgia   . Asthma   . Headache(784.0)     frequent  . Seasonal allergies   . Heart murmur   . Migraines   . Positive TB test   . Hiatal hernia   . Anxiety     Family History  Problem Relation Age of Onset  . Adopted: Yes  . Mental illness      parent  . Diabetes      parent     History   Social History  . Marital Status: Divorced    Spouse Name: N/A  . Number of Children: N/A  . Years of Education: N/A   Occupational History  . RN [employer]   Social History Main Topics  . Smoking status: Former Smoker -- 1 years    Types: Cigarettes  . Smokeless tobacco: Never Used  . Alcohol Use: Yes     Comment: 2-3/wk  . Drug Use: No  . Sexual Activity: No   Other Topics Concern  . Not on file   Social History Narrative   Work or School: Engineer, civil (consulting) for [employer]      Home Situation: lives with daughter      Spiritual Beliefs: christian   Lifestyle: no regular exercise, diet ok   1 dog   hh of 1    Divorced  For 12 years     Not working outside  Of home . 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   Both adopted parents died in the same year recently esophageal cancer and bladder cancer.   etoh 1-3 hs                          Outpatient Encounter Prescriptions as of 06/09/2014  Medication Sig  . Calcium Carbonate-Vitamin D (CALCIUM + D PO) Take by mouth.  . Cholecalciferol (VITAMIN D-3 PO) Take by mouth.  . DULoxetine (CYMBALTA) 60 MG capsule Take 120 mg by mouth daily.  Marland Kitchen gabapentin (NEURONTIN) 600 MG tablet 1 tablet six times daily  . Magnesium 500 MG TABS Take by mouth.  . metaxalone (SKELAXIN) 800 MG tablet Take 800 mg by  mouth 3 (three) times daily.  . naproxen sodium (ANAPROX) 220 MG tablet Taking 2 in the morning and 2 at night  . PROAIR HFA 108 (90 BASE) MCG/ACT inhaler USE 2 PUFFS INTO THE LUNGS EVERY 6 HOURS AS NEEDED  . QUEtiapine (SEROQUEL XR) 300 MG 24 hr tablet Take 600 mg by mouth at bedtime.   . ranitidine (ZANTAC) 150 MG tablet Take 150 mg by mouth 2 (two) times daily.  Marland Kitchen. buPROPion (WELLBUTRIN SR) 150 MG 12 hr tablet Take 150 mg by mouth daily.  . pantoprazole (PROTONIX) 40 MG tablet Take 1 tablet (40 mg total) by mouth daily.    EXAM:  BP 116/80 mmHg  Temp(Src) 98.1 F (36.7 C) (Oral)  Ht 5' 0.25" (1.53 m)  Wt 149 lb 11.2 oz (67.903 kg)  BMI 29.01 kg/m2  LMP 01/12/2011  Body mass index is 29.01 kg/(m^2).  GENERAL: vitals reviewed and listed above, alert, oriented, appears well hydrated and in no acute distress HEENT: atraumatic, conjunctiva  clear, no obvious abnormalities on inspection of external nose and ears NECK: no obvious masses on inspection palpation  abd protuberant  Mid line outpouching with arising and standing but cannot feel defect   No oab masses  g or rebound  MS: moves all extremities without noticeable focal  abnormality PSYCH: pleasant and cooperative, no obvious depression or anxiety  ASSESSMENT AND PLAN:  Discussed the following assessment and plan:  Gastroesophageal reflux disease, esophagitis presence not specified - go back on ppi failed h2 blocker  see gi referral ? need for screening colon and  upper endo? - Plan: Ambulatory referral to Gastroenterology  Hiatal hernia - Plan: Ambulatory referral to Gastroenterology  Other abdominal hernia ? vs diastesis - get surgery consult  - Plan: Ambulatory referral to General Surgery Go back on   Acid blockers .  -Patient advised to return or notify health care team  if symptoms worsen ,persist or new concerns arise.  Patient Instructions  Go back on PPI protonix for now  For the  relfux pain .  Uncertain if you have  An abd ventral  hernia or not .  We can do a surgery consult at any time.      Neta MendsWanda K. Brenna Friesenhahn M.D.

## 2014-06-09 NOTE — Patient Instructions (Signed)
Go back on PPI protonix for now  For the  relfux pain .  Uncertain if you have  An abd ventral  hernia or not .  We can do a surgery consult at any time.

## 2014-06-24 ENCOUNTER — Telehealth: Payer: Self-pay | Admitting: *Deleted

## 2014-06-24 DIAGNOSIS — Z1239 Encounter for other screening for malignant neoplasm of breast: Secondary | ICD-10-CM

## 2014-06-24 NOTE — Telephone Encounter (Signed)
Tried to call patient to ask about Mammogram and her voicemail has not been set up.  Will try later

## 2014-07-05 NOTE — Telephone Encounter (Signed)
Order placed, pt has not had one

## 2014-08-03 ENCOUNTER — Ambulatory Visit (INDEPENDENT_AMBULATORY_CARE_PROVIDER_SITE_OTHER): Payer: BLUE CROSS/BLUE SHIELD | Admitting: Gastroenterology

## 2014-08-03 ENCOUNTER — Encounter: Payer: Self-pay | Admitting: Gastroenterology

## 2014-08-03 VITALS — BP 100/70 | HR 90 | Ht 60.25 in

## 2014-08-03 DIAGNOSIS — K219 Gastro-esophageal reflux disease without esophagitis: Secondary | ICD-10-CM

## 2014-08-03 DIAGNOSIS — Z1211 Encounter for screening for malignant neoplasm of colon: Secondary | ICD-10-CM | POA: Diagnosis not present

## 2014-08-03 DIAGNOSIS — R1314 Dysphagia, pharyngoesophageal phase: Secondary | ICD-10-CM | POA: Diagnosis not present

## 2014-08-03 MED ORDER — NA SULFATE-K SULFATE-MG SULF 17.5-3.13-1.6 GM/177ML PO SOLN: 1.0000 | Freq: Once | ORAL | Status: DC

## 2014-08-03 NOTE — Patient Instructions (Addendum)

## 2014-08-03 NOTE — Assessment & Plan Note (Signed)
Complaints of dysphagia solids raises the question of an esophageal stricture.  Recommendations #1 EGD with dilation as indicated  CC Dr. Fabian Sharp

## 2014-08-03 NOTE — Progress Notes (Signed)
_                                                                                                                History of Present Illness:  [name] is a 54 year old white female referred at the request of Drs. Benna Dunks and tannish for evaluation of dysphagia and possible abdominal hernia.  She has a history of GERD which is fairly well-controlled with Protonix.  She complains of dysphagia to solids.  She claims to have a history of a hiatal hernia.  She also has a history of diastases recti.  Question of an umbilical hernia was raised when she was seen for evaluation of possible tummy tuck.  She's never had a colonoscopy.  She denies change of bowel habits or rectal bleeding.   Past Medical History  Diagnosis Date  . Depression   . GERD (gastroesophageal reflux disease)   . Hypertension   . IBS (irritable bowel syndrome)   . Fibromyalgia   . Asthma   . Headache(784.0)     frequent  . Seasonal allergies   . Heart murmur   . Migraines   . Positive TB test   . Hiatal hernia   . Anxiety    Past Surgical History  Procedure Laterality Date  . Cesarean section      x 3   . Cholecystectomy     family history includes Diabetes in an other family member; Mental illness in an other family member. She was adopted. Current Outpatient Prescriptions  Medication Sig Dispense Refill  . buPROPion (WELLBUTRIN SR) 150 MG 12 hr tablet Take 150 mg by mouth daily.    . Calcium Carbonate-Vitamin D (CALCIUM + D PO) Take by mouth.    . DULoxetine (CYMBALTA) 60 MG capsule Take 120 mg by mouth daily.    Marland Kitchen gabapentin (NEURONTIN) 600 MG tablet 1 tablet six times daily  5  . Magnesium 500 MG TABS Take by mouth.    . metaxalone (SKELAXIN) 800 MG tablet Take 800 mg by mouth 3 (three) times daily.  0  . naproxen sodium (ANAPROX) 220 MG tablet Taking 2 in the morning and 2 at night    . pantoprazole (PROTONIX) 40 MG tablet Take 1 tablet (40 mg total) by mouth daily. 30 tablet 3  . PROAIR  HFA 108 (90 BASE) MCG/ACT inhaler USE 2 PUFFS INTO THE LUNGS EVERY 6 HOURS AS NEEDED 8.5 Inhaler 2  . QUEtiapine (SEROQUEL XR) 300 MG 24 hr tablet Take 600 mg by mouth at bedtime.      No current facility-administered medications for this visit.   Allergies as of 08/03/2014 - Review Complete 08/03/2014  Allergen Reaction Noted  . Hydrocodone Itching 06/22/2012    reports that she has quit smoking. Her smoking use included Cigarettes. She quit after 1 year of use. She has never used smokeless tobacco. She reports that she drinks alcohol. She reports that she does not use illicit drugs.   Review of Systems: Pertinent positive and negative review of  systems were noted in the above HPI section. All other review of systems were otherwise negative.  Vital signs were reviewed in today's medical record Physical Exam: General: Well developed , well nourished, no acute distress Skin: anicteric Head: Normocephalic and atraumatic Eyes:  sclerae anicteric, EOMI Ears: Normal auditory acuity Mouth: No deformity or lesions Neck: Supple, no masses or thyromegaly Lymph Nodes: no lymphadenopathy Lungs: Clear throughout to auscultation Heart: Regular rate and rhythm; no murmurs, rubs or bruits Gastroinestinal: Soft, non tender and non distended. No masses, hepatosplenomegaly or hernias noted. Normal Bowel sounds.  She has diastases recti.  I do not appreciate a discrete hernia, per se. Rectal:deferred Musculoskeletal: Symmetrical with no gross deformities  Skin: No lesions on visible extremities Pulses:  Normal pulses noted Extremities: No clubbing, cyanosis, edema or deformities noted Neurological: Alert oriented x 4, grossly nonfocal Cervical Nodes:  No significant cervical adenopathy Inguinal Nodes: No significant inguinal adenopathy Psychological:  Alert and cooperative. Normal mood and affect  See Assessment and Plan under Problem List

## 2014-08-03 NOTE — Assessment & Plan Note (Signed)
Symptoms are well controlled with Protonix.  Plan to continue with the same. 

## 2014-08-03 NOTE — Assessment & Plan Note (Signed)
Plan screening colonoscopy 

## 2014-08-09 ENCOUNTER — Ambulatory Visit (INDEPENDENT_AMBULATORY_CARE_PROVIDER_SITE_OTHER): Payer: BLUE CROSS/BLUE SHIELD | Admitting: Internal Medicine

## 2014-08-09 ENCOUNTER — Encounter: Payer: Self-pay | Admitting: Internal Medicine

## 2014-08-09 DIAGNOSIS — F1010 Alcohol abuse, uncomplicated: Secondary | ICD-10-CM

## 2014-08-09 DIAGNOSIS — G8929 Other chronic pain: Secondary | ICD-10-CM | POA: Diagnosis not present

## 2014-08-09 DIAGNOSIS — F329 Major depressive disorder, single episode, unspecified: Secondary | ICD-10-CM

## 2014-08-09 DIAGNOSIS — F1090 Alcohol use, unspecified, uncomplicated: Secondary | ICD-10-CM

## 2014-08-09 DIAGNOSIS — F32A Depression, unspecified: Secondary | ICD-10-CM

## 2014-08-09 DIAGNOSIS — Z7289 Other problems related to lifestyle: Secondary | ICD-10-CM

## 2014-08-09 NOTE — Progress Notes (Signed)
Pre visit review using our clinic review tool, if applicable. No additional management support is needed unless otherwise documented below in the visit note.  Chief Complaint  Patient presents with  . Follow-up    wants help tostop etoh.    HPI: [name] 54 y.o.  comes in f because she wants to know what to do to get help for stopping alcohol. Last week  Had panic problem from drinking too much alcohol    And needs help  Was smoking mj and had meltdpown using mj  and alcohol for pain  And dosent know what to do .   Went oto grand babies b day party.  family noted that she was not good to drive must of had some behavioral changes police were called  Then police sent her homw by cab with confrontation.to remember some things  Behavioral.   Ems . Called .  ? Need help with not drinking etoh.   has not had any alcohol for a week. Denies any withdrawal symptoms. She states he uses alcohol marijuana to help control her pain. Doesn't know what to do. She thinks that there may be drug interactions with her other medicines. She sees Dr. Toy Care as the psychiatrist to she hasn't had discussed this issue. She denies severe depression since she has seen her them counselor in the remote past but not recently. ROS: See pertinent positives and negatives per HPI. no hallucinations or delusions.   Past Medical History  Diagnosis Date  . Depression   . GERD (gastroesophageal reflux disease)   . Hypertension   . IBS (irritable bowel syndrome)   . Fibromyalgia   . Asthma   . Headache(784.0)     frequent  . Seasonal allergies   . Heart murmur   . Migraines   . Positive TB test   . Hiatal hernia   . Anxiety     Family History  Problem Relation Age of Onset  . Adopted: Yes  . Mental illness      parent  . Diabetes      parent     History   Social History  . Marital Status: Divorced    Spouse Name: N/A  . Number of Children: N/A  . Years of Education: N/A   Occupational History    . RN [employer]   Social History Main Topics  . Smoking status: Former Smoker -- 1 years    Types: Cigarettes  . Smokeless tobacco: Never Used  . Alcohol Use: Yes     Comment: 2-3/wk  . Drug Use: No  . Sexual Activity: No   Other Topics Concern  . None   Social History Narrative   Work or School: Marine scientist for [employer]      Home Situation: lives with daughter      Spiritual Beliefs: christian   Lifestyle: no regular exercise, diet ok   1 dog  hh of 1    Divorced  For 12 years     Not working outside  Of home . 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   Both adopted parents died in the same year recently esophageal cancer and bladder cancer.   etoh 1-3 hs  Outpatient Prescriptions Prior to Visit  Medication Sig Dispense Refill  . buPROPion (WELLBUTRIN SR) 150 MG 12 hr tablet Take 150 mg by mouth daily.    . Calcium Carbonate-Vitamin D (CALCIUM + D PO) Take by mouth.    . DULoxetine (CYMBALTA) 60 MG capsule Take 120 mg by mouth daily.    Marland Kitchen gabapentin (NEURONTIN) 600 MG tablet 1 tablet six times daily  5  . Magnesium 500 MG TABS Take by mouth.    . metaxalone (SKELAXIN) 800 MG tablet Take 800 mg by mouth 3 (three) times daily.  0  . naproxen sodium (ANAPROX) 220 MG tablet Taking 2 in the morning and 2 at night    . pantoprazole (PROTONIX) 40 MG tablet Take 1 tablet (40 mg total) by mouth daily. 30 tablet 3  . PROAIR HFA 108 (90 BASE) MCG/ACT inhaler USE 2 PUFFS INTO THE LUNGS EVERY 6 HOURS AS NEEDED 8.5 Inhaler 2  . QUEtiapine (SEROQUEL XR) 300 MG 24 hr tablet Take 600 mg by mouth at bedtime.     . Na Sulfate-K Sulfate-Mg Sulf (SUPREP BOWEL PREP) SOLN Take 1 kit by mouth once. (Patient not taking: Reported on 08/09/2014) 1 Bottle 0   No facility-administered medications  prior to visit.     EXAM:  BP 120/84 mmHg  Temp(Src) 98.2 F (36.8 C) (Oral)  Wt 145 lb 6.4 oz (65.953 kg)  LMP 01/12/2011  Body mass index is 28.17 kg/(m^2).  GENERAL: vitals reviewed and listed above, alert, oriented, appears well hydrated and in no acute distress HEENT: atraumatic, conjunctiva  clear, no obvious abnormalities on inspection of external nose and earsPSYCH: pleasant and cooperative, no obvious depression or anxiety normal eye contact and speech.  Lab Results  Component Value Date   WBC 6.3 04/12/2014   HGB 12.1 04/12/2014   HCT 36.7 04/12/2014   PLT 275.0 04/12/2014   GLUCOSE 104* 04/12/2014   CHOL 217* 04/12/2014   TRIG 66.0 04/12/2014   HDL 78.60 04/12/2014   LDLDIRECT 102.1 04/09/2012   LDLCALC 125* 04/12/2014   ALT 9 04/12/2014   AST 12 04/12/2014   NA 143 04/12/2014   K 4.8 04/12/2014   CL 105 04/12/2014   CREATININE 0.74 04/12/2014   BUN 14 04/12/2014   CO2 33* 04/12/2014   TSH 3.10 04/12/2014   INR 0.9 10/01/2008   HGBA1C 5.2 04/09/2012   Health Maintenance Due  Topic Date Due  . HIV Screening  08/28/1974  . PAP SMEAR  08/27/1977  . TETANUS/TDAP  08/28/1978  . MAMMOGRAM  08/27/2009  . COLONOSCOPY  08/27/2009    ASSESSMENT AND PLAN:  Discussed the following assessment and plan:  Alcohol problem drinking - has stopped for a week and asking for help  self medicatingno wd sx  has hx of some black outs.  Chronic pain - ms   Depression - psychiatry managment  discussed strategies that she would do best by talking to a alcohol or substance use counselor in addition to discussing with her psychiatrist. Her psychiatrist needs to know the same information as it can interfere with her augment medication side effects. And treatment for depression. She agrees that getting off alcohol substances first would be helpful as far as her mood goes. These events alarm to her and realized that it could've been a lot worse. Commended her for asking for help  direction discussed. He is not acutely suicidal and appears to have normal thought process  Total visit 62mins > 50% spent counseling and coordinating  care as indicated in above note and in instructions to patient .  -Patient advised to return or notify health care team  if symptoms worsen ,persist or new concerns arise.  Patient Instructions  I advise get help with  No alcohol.  i advse getting with dr Toy Care about  substance use alcohol .,   Alcohol and MJ can cause anxiety and make it worse.  Usually want to take the least medication possible to get positive and  Less negative effect.     Alcohol Use Disorder Alcohol use disorder is a mental disorder. It is not a one-time incident of heavy drinking. Alcohol use disorder is the excessive and uncontrollable use of alcohol over time that leads to problems with functioning in one or more areas of daily living. People with this disorder risk harming themselves and others when they drink to excess. Alcohol use disorder also can cause other mental disorders, such as mood and anxiety disorders, and serious physical problems. People with alcohol use disorder often misuse other drugs.  Alcohol use disorder is common and widespread. Some people with this disorder drink alcohol to cope with or escape from negative life events. Others drink to relieve chronic pain or symptoms of mental illness. People with a family history of alcohol use disorder are at higher risk of losing control and using alcohol to excess.  SYMPTOMS  Signs and symptoms of alcohol use disorder may include the following:   Consumption ofalcohol inlarger amounts or over a longer period of time than intended.  Multiple unsuccessful attempts to cutdown or control alcohol use.   A great deal of time spent obtaining alcohol, using alcohol, or recovering from the effects of alcohol (hangover).  A strong desire or urge to use alcohol (cravings).   Continued use of alcohol despite  problems at work, school, or home because of alcohol use.   Continued use of alcohol despite problems in relationships because of alcohol use.  Continued use of alcohol in situations when it is physically hazardous, such as driving a car.  Continued use of alcohol despite awareness of a physical or psychological problem that is likely related to alcohol use. Physical problems related to alcohol use can involve the brain, heart, liver, stomach, and intestines. Psychological problems related to alcohol use include intoxication, depression, anxiety, psychosis, delirium, and dementia.   The need for increased amounts of alcohol to achieve the same desired effect, or a decreased effect from the consumption of the same amount of alcohol (tolerance).  Withdrawal symptoms upon reducing or stopping alcohol use, or alcohol use to reduce or avoid withdrawal symptoms. Withdrawal symptoms include:  Racing heart.  Hand tremor.  Difficulty sleeping.  Nausea.  Vomiting.  Hallucinations.  Restlessness.  Seizures. DIAGNOSIS Alcohol use disorder is diagnosed through an assessment by your health care provider. Your health care provider may start by asking three or four questions to screen for excessive or problematic alcohol use. To confirm a diagnosis of alcohol use disorder, at least two symptoms must be present within a 72-month period. The severity of alcohol use disorder depends on the number of symptoms:  Mild--two or three.  Moderate--four or five.  Severe--six or more. Your health care provider may perform a physical exam or use results from lab tests to see if you have physical problems resulting from alcohol use. Your health care provider may refer you to a mental health professional for evaluation. TREATMENT  Some people with alcohol use disorder are able to reduce their alcohol  use to low-risk levels. Some people with alcohol use disorder need to quit drinking alcohol. When necessary,  mental health professionals with specialized training in substance use treatment can help. Your health care provider can help you decide how severe your alcohol use disorder is and what type of treatment you need. The following forms of treatment are available:   Detoxification. Detoxification involves the use of prescription medicines to prevent alcohol withdrawal symptoms in the first week after quitting. This is important for people with a history of symptoms of withdrawal and for heavy drinkers who are likely to have withdrawal symptoms. Alcohol withdrawal can be dangerous and, in severe cases, cause death. Detoxification is usually provided in a hospital or in-patient substance use treatment facility.  Counseling or talk therapy. Talk therapy is provided by substance use treatment counselors. It addresses the reasons people use alcohol and ways to keep them from drinking again. The goals of talk therapy are to help people with alcohol use disorder find healthy activities and ways to cope with life stress, to identify and avoid triggers for alcohol use, and to handle cravings, which can cause relapse.  Medicines.Different medicines can help treat alcohol use disorder through the following actions:  Decrease alcohol cravings.  Decrease the positive reward response felt from alcohol use.  Produce an uncomfortable physical reaction when alcohol is used (aversion therapy).  Support groups. Support groups are run by people who have quit drinking. They provide emotional support, advice, and guidance. These forms of treatment are often combined. Some people with alcohol use disorder benefit from intensive combination treatment provided by specialized substance use treatment centers. Both inpatient and outpatient treatment programs are available. Document Released: 03/07/2004 Document Revised: 06/14/2013 Document Reviewed: 05/07/2012 [facility] Patient Information 2015 [place], [place]. This information  is not intended to replace advice given to you by your health care provider. Make sure you discuss any questions you have with your health care provider.      Standley Brooking. Jaide Hillenburg M.D.

## 2014-08-09 NOTE — Patient Instructions (Signed)
I advise get help with  No alcohol.  i advse getting with dr Evelene CroonKaur about  substance use alcohol .,   Alcohol and MJ can cause anxiety and make it worse.  Usually want to take the least medication possible to get positive and  Less negative effect.     Alcohol Use Disorder Alcohol use disorder is a mental disorder. It is not a one-time incident of heavy drinking. Alcohol use disorder is the excessive and uncontrollable use of alcohol over time that leads to problems with functioning in one or more areas of daily living. People with this disorder risk harming themselves and others when they drink to excess. Alcohol use disorder also can cause other mental disorders, such as mood and anxiety disorders, and serious physical problems. People with alcohol use disorder often misuse other drugs.  Alcohol use disorder is common and widespread. Some people with this disorder drink alcohol to cope with or escape from negative life events. Others drink to relieve chronic pain or symptoms of mental illness. People with a family history of alcohol use disorder are at higher risk of losing control and using alcohol to excess.  SYMPTOMS  Signs and symptoms of alcohol use disorder may include the following:   Consumption ofalcohol inlarger amounts or over a longer period of time than intended.  Multiple unsuccessful attempts to cutdown or control alcohol use.   A great deal of time spent obtaining alcohol, using alcohol, or recovering from the effects of alcohol (hangover).  A strong desire or urge to use alcohol (cravings).   Continued use of alcohol despite problems at work, school, or home because of alcohol use.   Continued use of alcohol despite problems in relationships because of alcohol use.  Continued use of alcohol in situations when it is physically hazardous, such as driving a car.  Continued use of alcohol despite awareness of a physical or psychological problem that is likely related to  alcohol use. Physical problems related to alcohol use can involve the brain, heart, liver, stomach, and intestines. Psychological problems related to alcohol use include intoxication, depression, anxiety, psychosis, delirium, and dementia.   The need for increased amounts of alcohol to achieve the same desired effect, or a decreased effect from the consumption of the same amount of alcohol (tolerance).  Withdrawal symptoms upon reducing or stopping alcohol use, or alcohol use to reduce or avoid withdrawal symptoms. Withdrawal symptoms include:  Racing heart.  Hand tremor.  Difficulty sleeping.  Nausea.  Vomiting.  Hallucinations.  Restlessness.  Seizures. DIAGNOSIS Alcohol use disorder is diagnosed through an assessment by your health care provider. Your health care provider may start by asking three or four questions to screen for excessive or problematic alcohol use. To confirm a diagnosis of alcohol use disorder, at least two symptoms must be present within a 4667-month period. The severity of alcohol use disorder depends on the number of symptoms:  Mild--two or three.  Moderate--four or five.  Severe--six or more. Your health care provider may perform a physical exam or use results from lab tests to see if you have physical problems resulting from alcohol use. Your health care provider may refer you to a mental health professional for evaluation. TREATMENT  Some people with alcohol use disorder are able to reduce their alcohol use to low-risk levels. Some people with alcohol use disorder need to quit drinking alcohol. When necessary, mental health professionals with specialized training in substance use treatment can help. Your health care provider can help  you decide how severe your alcohol use disorder is and what type of treatment you need. The following forms of treatment are available:   Detoxification. Detoxification involves the use of prescription medicines to prevent  alcohol withdrawal symptoms in the first week after quitting. This is important for people with a history of symptoms of withdrawal and for heavy drinkers who are likely to have withdrawal symptoms. Alcohol withdrawal can be dangerous and, in severe cases, cause death. Detoxification is usually provided in a hospital or in-patient substance use treatment facility.  Counseling or talk therapy. Talk therapy is provided by substance use treatment counselors. It addresses the reasons people use alcohol and ways to keep them from drinking again. The goals of talk therapy are to help people with alcohol use disorder find healthy activities and ways to cope with life stress, to identify and avoid triggers for alcohol use, and to handle cravings, which can cause relapse.  Medicines.Different medicines can help treat alcohol use disorder through the following actions:  Decrease alcohol cravings.  Decrease the positive reward response felt from alcohol use.  Produce an uncomfortable physical reaction when alcohol is used (aversion therapy).  Support groups. Support groups are run by people who have quit drinking. They provide emotional support, advice, and guidance. These forms of treatment are often combined. Some people with alcohol use disorder benefit from intensive combination treatment provided by specialized substance use treatment centers. Both inpatient and outpatient treatment programs are available. Document Released: 03/07/2004 Document Revised: 06/14/2013 Document Reviewed: 05/07/2012 [facility] Patient Information 2015 [place], [place]. This information is not intended to replace advice given to you by your health care provider. Make sure you discuss any questions you have with your health care provider.

## 2014-09-07 ENCOUNTER — Encounter: Payer: BLUE CROSS/BLUE SHIELD | Admitting: Internal Medicine

## 2014-09-07 NOTE — Progress Notes (Signed)
Document opened and reviewed for wellness visit . No showed .  

## 2014-10-03 ENCOUNTER — Telehealth: Payer: Self-pay | Admitting: Gastroenterology

## 2014-10-03 ENCOUNTER — Other Ambulatory Visit: Payer: Self-pay | Admitting: Internal Medicine

## 2014-10-03 NOTE — Telephone Encounter (Signed)
Yes

## 2014-10-04 ENCOUNTER — Encounter: Payer: BLUE CROSS/BLUE SHIELD | Admitting: Gastroenterology

## 2014-10-04 NOTE — Telephone Encounter (Signed)
Ok x 6 months 

## 2014-10-05 NOTE — Telephone Encounter (Signed)
Sent to the pharmacy by e-scribe. 

## 2014-10-24 ENCOUNTER — Encounter: Payer: BLUE CROSS/BLUE SHIELD | Admitting: Gastroenterology

## 2015-03-20 ENCOUNTER — Other Ambulatory Visit: Payer: Self-pay | Admitting: Internal Medicine

## 2015-03-20 DIAGNOSIS — Z1231 Encounter for screening mammogram for malignant neoplasm of breast: Secondary | ICD-10-CM

## 2015-04-24 ENCOUNTER — Other Ambulatory Visit: Payer: Self-pay | Admitting: Internal Medicine

## 2015-04-24 ENCOUNTER — Telehealth: Payer: Self-pay | Admitting: Family Medicine

## 2015-04-24 ENCOUNTER — Other Ambulatory Visit: Payer: Self-pay | Admitting: Family Medicine

## 2015-04-24 DIAGNOSIS — Z0000 Encounter for general adult medical examination without abnormal findings: Secondary | ICD-10-CM

## 2015-04-24 NOTE — Telephone Encounter (Addendum)
Unable to lm voicemail full

## 2015-04-24 NOTE — Telephone Encounter (Signed)
Sent to the pharmacy by e-scribe.  Message sent to scheduling.  Pt now due for lab work and cpx or yearly visit.

## 2015-04-24 NOTE — Telephone Encounter (Signed)
Pt due for lab work and cpx/yearly visit.  Please help her to make both appointments.  I have placed fasting lab orders.  Thanks!

## 2015-04-26 NOTE — Telephone Encounter (Signed)
Mailbox full unable to leave message.

## 2015-04-26 NOTE — Telephone Encounter (Signed)
Pt does not have ins now and will callback

## 2016-02-12 HISTORY — PX: BREAST BIOPSY: SHX20

## 2016-03-27 DIAGNOSIS — R05 Cough: Secondary | ICD-10-CM | POA: Diagnosis not present

## 2016-03-27 DIAGNOSIS — R0602 Shortness of breath: Secondary | ICD-10-CM | POA: Diagnosis not present

## 2016-08-19 DIAGNOSIS — F331 Major depressive disorder, recurrent, moderate: Secondary | ICD-10-CM | POA: Diagnosis not present

## 2016-09-24 DIAGNOSIS — F331 Major depressive disorder, recurrent, moderate: Secondary | ICD-10-CM | POA: Diagnosis not present

## 2016-11-18 ENCOUNTER — Other Ambulatory Visit (HOSPITAL_COMMUNITY)
Admission: RE | Admit: 2016-11-18 | Discharge: 2016-11-18 | Disposition: A | Discharge status: Home / Self Care | Payer: BLUE CROSS/BLUE SHIELD | Source: Ambulatory Visit | Attending: Internal Medicine | Admitting: Internal Medicine

## 2016-11-18 ENCOUNTER — Ambulatory Visit (INDEPENDENT_AMBULATORY_CARE_PROVIDER_SITE_OTHER): Payer: BLUE CROSS/BLUE SHIELD | Admitting: Internal Medicine

## 2016-11-18 ENCOUNTER — Encounter: Payer: Self-pay | Admitting: Internal Medicine

## 2016-11-18 VITALS — BP 160/96 | HR 94 | Temp 98.7°F | Resp 16 | Ht 60.0 in | Wt 123.8 lb

## 2016-11-18 DIAGNOSIS — Z0000 Encounter for general adult medical examination without abnormal findings: Secondary | ICD-10-CM | POA: Diagnosis not present

## 2016-11-18 DIAGNOSIS — Z01419 Encounter for gynecological examination (general) (routine) without abnormal findings: Secondary | ICD-10-CM

## 2016-11-18 DIAGNOSIS — M797 Fibromyalgia: Secondary | ICD-10-CM | POA: Diagnosis not present

## 2016-11-18 DIAGNOSIS — E559 Vitamin D deficiency, unspecified: Secondary | ICD-10-CM

## 2016-11-18 DIAGNOSIS — Z1211 Encounter for screening for malignant neoplasm of colon: Secondary | ICD-10-CM | POA: Diagnosis not present

## 2016-11-18 DIAGNOSIS — Z2821 Immunization not carried out because of patient refusal: Secondary | ICD-10-CM | POA: Diagnosis not present

## 2016-11-18 DIAGNOSIS — Z124 Encounter for screening for malignant neoplasm of cervix: Secondary | ICD-10-CM

## 2016-11-18 DIAGNOSIS — R030 Elevated blood-pressure reading, without diagnosis of hypertension: Secondary | ICD-10-CM

## 2016-11-18 DIAGNOSIS — E7800 Pure hypercholesterolemia, unspecified: Secondary | ICD-10-CM | POA: Insufficient documentation

## 2016-11-18 NOTE — Patient Instructions (Addendum)
Need fu of BP readings .  As reading are elevated .  Take blood pressure readings twice a day for 7- 10 days and then periodically .To ensure below 140/90   . Goal 120/80Send in readings      May need rov to discuss  Intervention if elevated    Blood work  Results  When available .   To you  Let us look at records from [facility] about the reclast .    Will do referral  For colonoscopy .  Get a mammogram .    Preventive Care 40-64 Years, Female Preventive care refers to lifestyle choices and visits with your health care provider that can promote health and wellness. What does preventive care include?  A yearly physical exam. This is also called an annual well check.  Dental exams once or twice a year.  Routine eye exams. Ask your health care provider how often you should have your eyes checked.  Personal lifestyle choices, including: ? Daily care of your teeth and gums. ? Regular physical activity. ? Eating a healthy diet. ? Avoiding tobacco and drug use. ? Limiting alcohol use. ? Practicing safe sex. ? Taking low-dose aspirin daily starting at age 59. ? Taking vitamin and mineral supplements as recommended by your health care provider. What happens during an annual well check? The services and screenings done by your health care provider during your annual well check will depend on your age, overall health, lifestyle risk factors, and family history of disease. Counseling Your health care provider may ask you questions about your:  Alcohol use.  Tobacco use.  Drug use.  Emotional well-being.  Home and relationship well-being.  Sexual activity.  Eating habits.  Work and work Statistician.  Method of birth control.  Menstrual cycle.  Pregnancy history.  Screening You may have the following tests or measurements:  Height, weight, and BMI.  Blood pressure.  Lipid and cholesterol levels. These may be checked every 5 years, or more frequently if you are over 33  years old.  Skin check.  Lung cancer screening. You may have this screening every year starting at age 64 if you have a 30-pack-year history of smoking and currently smoke or have quit within the past 15 years.  Fecal occult blood test (FOBT) of the stool. You may have this test every year starting at age 60.  Flexible sigmoidoscopy or colonoscopy. You may have a sigmoidoscopy every 5 years or a colonoscopy every 10 years starting at age 26.  Hepatitis C blood test.  Hepatitis B blood test.  Sexually transmitted disease (STD) testing.  Diabetes screening. This is done by checking your blood sugar (glucose) after you have not eaten for a while (fasting). You may have this done every 1-3 years.  Mammogram. This may be done every 1-2 years. Talk to your health care provider about when you should start having regular mammograms. This may depend on whether you have a family history of breast cancer.  BRCA-related cancer screening. This may be done if you have a family history of breast, ovarian, tubal, or peritoneal cancers.  Pelvic exam and Pap test. This may be done every 3 years starting at age 48. Starting at age 26, this may be done every 5 years if you have a Pap test in combination with an HPV test.  Bone density scan. This is done to screen for osteoporosis. You may have this scan if you are at high risk for osteoporosis.  Discuss your test  results, treatment options, and if necessary, the need for more tests with your health care provider. Vaccines Your health care provider may recommend certain vaccines, such as:  Influenza vaccine. This is recommended every year.  Tetanus, diphtheria, and acellular pertussis (Tdap, Td) vaccine. You may need a Td booster every 10 years.  Varicella vaccine. You may need this if you have not been vaccinated.  Zoster vaccine. You may need this after age 68.  Measles, mumps, and rubella (MMR) vaccine. You may need at least one dose of MMR if  you were born in 1957 or later. You may also need a second dose.  Pneumococcal 13-valent conjugate (PCV13) vaccine. You may need this if you have certain conditions and were not previously vaccinated.  Pneumococcal polysaccharide (PPSV23) vaccine. You may need one or two doses if you smoke cigarettes or if you have certain conditions.  Meningococcal vaccine. You may need this if you have certain conditions.  Hepatitis A vaccine. You may need this if you have certain conditions or if you travel or work in places where you may be exposed to hepatitis A.  Hepatitis B vaccine. You may need this if you have certain conditions or if you travel or work in places where you may be exposed to hepatitis B.  Haemophilus influenzae type b (Hib) vaccine. You may need this if you have certain conditions.  Talk to your health care provider about which screenings and vaccines you need and how often you need them. This information is not intended to replace advice given to you by your health care provider. Make sure you discuss any questions you have with your health care provider. Document Released: 02/24/2015 Document Revised: 10/18/2015 Document Reviewed: 11/29/2014 Elsevier Interactive Patient Education  2017 [place].   How to Take Your Blood Pressure Blood pressure is a measurement of how strongly your blood is pressing against the walls of your arteries. Arteries are blood vessels that carry blood from your heart throughout your body. Your health care provider takes your blood pressure at each office visit. You can also take your own blood pressure at home with a blood pressure machine. You may need to take your own blood pressure:  To confirm a diagnosis of high blood pressure (hypertension).  To monitor your blood pressure over time.  To make sure your blood pressure medicine is working.  Supplies needed: To take your blood pressure, you will need a blood pressure machine. You can buy a  blood pressure machine, or blood pressure monitor, at most drugstores or online. There are several types of home blood pressure monitors. When choosing one, consider the following:  Choose a monitor that has an arm cuff.  Choose a monitor that wraps snugly around your upper arm. You should be able to fit only one finger between your arm and the cuff.  Do not choose a monitor that measures your blood pressure from your wrist or finger.  Your health care provider can suggest a reliable monitor that will meet your needs. How to prepare To get the most accurate reading, avoid the following for 30 minutes before you check your blood pressure:  Drinking caffeine.  Drinking alcohol.  Eating.  Smoking.  Exercising.  Five minutes before you check your blood pressure:  Empty your bladder.  Sit quietly without talking in a dining chair, rather than in a soft couch or armchair.  How to take your blood pressure To check your blood pressure, follow the instructions in the manual that  came with your blood pressure monitor. If you have a digital blood pressure monitor, the instructions may be as follows: 1. Sit up straight. 2. Place your feet on the floor. Do not cross your ankles or legs. 3. Rest your left arm at the level of your heart on a table or desk or on the arm of a chair. 4. Pull up your shirt sleeve. 5. Wrap the blood pressure cuff around the upper part of your left arm, 1 inch (2.5 cm) above your elbow. It is best to wrap the cuff around bare skin. 6. Fit the cuff snugly around your arm. You should be able to place only one finger between the cuff and your arm. 7. Position the cord inside the groove of your elbow. 8. Press the power button. 9. Sit quietly while the cuff inflates and deflates. 10. Read the digital reading on the monitor screen and write it down (record it). 11. Wait 2-3 minutes, then repeat the steps, starting at step 1.  What does my blood pressure reading  mean? A blood pressure reading consists of a higher number over a lower number. Ideally, your blood pressure should be below 120/80. The first ("top") number is called the systolic pressure. It is a measure of the pressure in your arteries as your heart beats. The second ("bottom") number is called the diastolic pressure. It is a measure of the pressure in your arteries as the heart relaxes. Blood pressure is classified into four stages. The following are the stages for adults who do not have a short-term serious illness or a chronic condition. Systolic pressure and diastolic pressure are measured in a unit called mm Hg. Normal  Systolic pressure: below 654.  Diastolic pressure: below 80. Elevated  Systolic pressure: 650-354.  Diastolic pressure: below 80. Hypertension stage 1  Systolic pressure: 656-812.  Diastolic pressure: 75-17. Hypertension stage 2  Systolic pressure: 001 or above.  Diastolic pressure: 90 or above. You can have prehypertension or hypertension even if only the systolic or only the diastolic number in your reading is higher than normal. Follow these instructions at home:  Check your blood pressure as often as recommended by your health care provider.  Take your monitor to the next appointment with your health care provider to make sure: ? That you are using it correctly. ? That it provides accurate readings.  Be sure you understand what your goal blood pressure numbers are.  Tell your health care provider if you are having any side effects from blood pressure medicine. Contact a health care provider if:  Your blood pressure is consistently high. Get help right away if:  Your systolic blood pressure is higher than 180.  Your diastolic blood pressure is higher than 110. This information is not intended to replace advice given to you by your health care provider. Make sure you discuss any questions you have with your health care provider. Document  Released: 07/07/2015 Document Revised: 09/19/2015 Document Reviewed: 07/07/2015 Elsevier Interactive Patient Education  [employer].

## 2016-11-18 NOTE — Progress Notes (Signed)
No chief complaint on file.   HPI: Patient  [name]  56 y.o. comes in today for SAD    For  Preventive Health Care visit   Last visit 6 16  She had been without insurance until just recently.  No preventive here but this is due. She has not had her colon cancer screen her recent mammogram. Asks about getting a vitamin D level was told it was low in the past Asks about getting Reclast infusion apparently had 1 related to neurosurgery evaluation for see her arthritis in her spine no fracture. We do not have these records. Remote history of abnormal Pap smear pre-child with cryotherapy and her last Pap smear normal however they may have been anywhere from 5-10 years ago. Her only regular provider is Dr. Robina Ade for depression that she states isn't doing that well on Cymbalta and given Neurontin and trazodone for DJD fibromyalgia and addition to her depression. She is also taking gabapentin. Takes naproxen to twice a day. As used albuterol as needed in the past for question seasonal wheezing but no specific diagnosis of asthma by spirometry.   Health Maintenance  Topic Date Due  . Hepatitis C Screening  [birth year]  . HIV Screening  08/28/1974  . TETANUS/TDAP  08/28/1978  . PAP SMEAR  08/27/1980  . MAMMOGRAM  08/27/2009  . COLONOSCOPY  08/27/2009  . INFLUENZA VACCINE  05/11/2017 (Originally 09/11/2016)   Health Maintenance Review LIFESTYLE:  Exercise:   no Tobacco/ETS:  none Alcohol:  Out to eat .  Sugar beverages: normal water  Coffee  Sleep:  At least 8   And some more.  Drug use: no   HH of  1   2 dogs  Work:   No employed   No work .   Family  Responsibilities   ROS:  GEN/ HEENT: No fever, significant weight changes sweats headaches vision problems hearing changes, CV/ PULM; No chest pain shortness of breath cough, syncope,edema  change in exercise tolerance. GI /GU: No adominal pain, vomiting, change in bowel habits. No blood in the stool. No significant GU  symptoms. SKIN/HEME: ,no acute skin rashes suspicious lesions or bleeding. No lymphadenopathy, nodules, masses.  NEURO/ PSYCH:  No neurologic signs such as weakness numbness. Under careIMM/ Allergy: No unusual infections.  Allergy .   REST of 12 system review negative except as per HPI   Past Medical History:  Diagnosis Date  . Anxiety   . Asthma   . Depression   . Fibromyalgia   . GERD (gastroesophageal reflux disease)   . Headache(784.0)    frequent  . Heart murmur   . Hiatal hernia   . Hypertension   . IBS (irritable bowel syndrome)   . Migraines   . Positive TB test   . Seasonal allergies     Past Surgical History:  Procedure Laterality Date  . CESAREAN SECTION     x 3   . CHOLECYSTECTOMY      Family History  Problem Relation Age of Onset  . Adopted: Yes  . Mental illness Unknown        parent  . Diabetes Unknown        parent     Social History   Social History  . Marital status: Divorced    Spouse name: N/A  . Number of children: N/A  . Years of education: N/A   Occupational History  . RN [employer]   Social History Main Topics  .  Smoking status: Former Smoker    Years: 1.00    Types: Cigarettes  . Smokeless tobacco: Never Used  . Alcohol use Yes     Comment: 2-3/wk  . Drug use: No  . Sexual activity: No   Other Topics Concern  . None   Social History Narrative   Work or School: Marine scientist for [employer]      Home Situation: lives with daughter      Spiritual Beliefs: christian   Lifestyle: no regular exercise, diet ok   1 dog  hh of 1    Divorced  For 12 years     Not working outside  Of home . 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   Both adopted parents died in the same year recently esophageal cancer and bladder cancer.   etoh 1-3 hs    Now  Lives alone    Not working. Fibromyalgia  Limites her  Somewhat?                         Outpatient Medications Prior to Visit  Medication Sig Dispense Refill  . Calcium Carbonate-Vitamin D (CALCIUM + D PO) Take by mouth.    . DULoxetine (CYMBALTA) 60 MG capsule Take 120 mg by mouth daily.    Marland Kitchen gabapentin (NEURONTIN) 600 MG tablet 1 tablet six times daily  5  . Magnesium 500 MG TABS Take by mouth.    . naproxen sodium (ANAPROX) 220 MG tablet Taking 2 in the morning and 2 at night    . PROAIR HFA 108 (90 BASE) MCG/ACT inhaler USE 2 PUFFS INTO THE LUNGS EVERY 6 HOURS AS NEEDED 8.5 Inhaler 2  . buPROPion (WELLBUTRIN SR) 150 MG 12 hr tablet Take 150 mg by mouth daily.    . metaxalone (SKELAXIN) 800 MG tablet Take 800 mg by mouth 3 (three) times daily.  0  . Na Sulfate-K Sulfate-Mg Sulf (SUPREP BOWEL PREP) SOLN Take 1 kit by mouth once. (Patient not taking: Reported on 08/09/2014) 1 Bottle 0  . pantoprazole (PROTONIX) 40 MG tablet TAKE 1 TABLET (40 MG TOTAL) BY MOUTH DAILY. 30 tablet 1  . QUEtiapine (SEROQUEL XR) 300 MG 24 hr tablet Take 600 mg by mouth at bedtime.      No facility-administered medications prior to visit.      EXAM:  BP (!) 160/96   Pulse 94   Temp 98.7 F (37.1 C) (Oral)   Resp 16   Ht 5' (1.524 m)   Wt 123 lb 12.8 oz (56.2 kg)   LMP 01/12/2011   SpO2 97%   BMI 24.18 kg/m   Body mass index is 24.18 kg/m. Wt Readings from Last 3 Encounters:  11/18/16 123 lb 12.8 oz (56.2 kg)  08/09/14 145 lb 6.4 oz (66 kg)  06/09/14 149 lb 11.2 oz (67.9 kg)    Physical Exam: Vital signs reviewed EXH:BZJI is a well-developed well-nourished alert cooperative    who appearsr stated age in no acute distress.  Looks depressed or subdued  Cognition is normal  HEENT: normocephalic atraumatic , Eyes: PERRL EOM's full, conjunctiva clear, Nares: paten,t no deformity discharge or tenderness., Ears: no deformity EAC's clear TMs with normal landmarks. Mouth: clear OP, no lesions, edema.  Moist  mucous membranes. Dentition in  adequate repair. NECK: supple without masses, thyromegaly or bruits. CHEST/PULM:  Clear to auscultation and percussion breath sounds equal no wheeze , rales or rhonchi. No chest wall deformities or tenderness. Breast: normal by inspection . No dimpling, discharge, masses, tenderness or discharge . CV: PMI is nondisplaced, S1 S2 no gallops, murmurs, rubs. Peripheral pulses are full without delay.No JVD .  ABDOMEN: Bowel sounds normal nontender  No guard or rebound, no hepato splenomegal no CVA tenderness.  No hernia. Extremtities:  No clubbing cyanosis or edema, no acute joint swelling or redness no focal atrophy NEURO:  Oriented x3, cranial nerves 3-12 appear to be intact, no obvious focal weakness,gait within normal limits no abnormal reflexes or asymmetrical SKIN: No acute rashes normal turgor, color, no bruising or petechiae. PSYCH: Oriented, good eye contact, no obvious depression anxiety, cognition and judgment appear normal. LN: no cervical axillary inguinal adenopathy Pelvic: NL ext GU, labia clear without lesions or rash . Vagina no lesions .Cervix: clear  UTERUS: Neg CMT Adnexa:  clear no masses . PAP done HR hpv  And rectal no massess  Lab Results  Component Value Date   WBC 6.3 04/12/2014   HGB 12.1 04/12/2014   HCT 36.7 04/12/2014   PLT 275.0 04/12/2014   GLUCOSE 104 (H) 04/12/2014   CHOL 217 (H) 04/12/2014   TRIG 66.0 04/12/2014   HDL 78.60 04/12/2014   LDLDIRECT 102.1 04/09/2012   LDLCALC 125 (H) 04/12/2014   ALT 9 04/12/2014   AST 12 04/12/2014   NA 143 04/12/2014   K 4.8 04/12/2014   CL 105 04/12/2014   CREATININE 0.74 04/12/2014   BUN 14 04/12/2014   CO2 33 (H) 04/12/2014   TSH 3.10 04/12/2014   INR 0.9 10/01/2008   HGBA1C 5.2 04/09/2012    BP Readings from Last 3 Encounters:  11/18/16 (!) 160/96  08/09/14 120/84  08/03/14 100/70  repeat bp 150/88 range   Lab results reviewed with patient   ASSESSMENT AND  PLAN:  Discussed the following assessment and plan:  Visit for preventive health examination - get utd disc  can get td at future time - Plan: Basic metabolic panel, CBC with Differential/Platelet, Hepatic function panel, Lipid panel, TSH  Cervical cancer screening - Plan: Cytology - PAP  Encounter for gynecological examination without abnormal finding - Plan: Basic metabolic panel, CBC with Differential/Platelet, Hepatic function panel, Lipid panel, TSH  Elevated blood pressure reading - Plan: Basic metabolic panel, CBC with Differential/Platelet, Hepatic function panel, Lipid panel, TSH, VITAMIN D 25 Hydroxy (Vit-D Deficiency, Fractures)  Vitamin D deficiency - Plan: Basic metabolic panel, CBC with Differential/Platelet, Hepatic function panel, Lipid panel, TSH, VITAMIN D 25 Hydroxy (Vit-D Deficiency, Fractures)  Elevated cholesterol - Plan: Basic metabolic panel, CBC with Differential/Platelet, Hepatic function panel, Lipid panel, TSH  Colon cancer screening - Plan: Ambulatory referral to Gastroenterology  Influenza vaccination declined by patient  Fibromyalgia   lack of primary care for a while because of lack of insurance. Discussed updating her health care parameters. Get more information about the request infusion. No documentation of fracture so uncertain what that was about.  Maybe compression fx  May need  dexa or other  Discussed perhaps additional counseling regard to her factors such as parents death and no time to grieve among other things. Her blood pressure is too high today although she thinks this is a transient phenomenon. Advised her to get readings send them in or follow-up visit consideration of adding medications of continued elevated. Patient Care Team:  Alanta Scobey, Standley Brooking, MD as PCP - General (Internal Medicine) Chucky May, MD as Consulting Physician (Psychiatry) Crista Luria, MD as Consulting Physician (Dermatology) Glenna Fellows, MD as Attending Physician  (Neurosurgery) Chucky May, MD as Consulting Physician (Psychiatry) Patient Instructions  Need fu of BP readings .  As reading are elevated .  Take blood pressure readings twice a day for 7- 10 days and then periodically .To ensure below 140/90   . Goal 120/80Send in readings      May need rov to discuss  Intervention if elevated    Blood work  Results  When available .   To you  Let us look at records from [facility] about the reclast .    Will do referral  For colonoscopy .  Get a mammogram .    Preventive Care 40-64 Years, Female Preventive care refers to lifestyle choices and visits with your health care provider that can promote health and wellness. What does preventive care include?  A yearly physical exam. This is also called an annual well check.  Dental exams once or twice a year.  Routine eye exams. Ask your health care provider how often you should have your eyes checked.  Personal lifestyle choices, including: ? Daily care of your teeth and gums. ? Regular physical activity. ? Eating a healthy diet. ? Avoiding tobacco and drug use. ? Limiting alcohol use. ? Practicing safe sex. ? Taking low-dose aspirin daily starting at age 52. ? Taking vitamin and mineral supplements as recommended by your health care provider. What happens during an annual well check? The services and screenings done by your health care provider during your annual well check will depend on your age, overall health, lifestyle risk factors, and family history of disease. Counseling Your health care provider may ask you questions about your:  Alcohol use.  Tobacco use.  Drug use.  Emotional well-being.  Home and relationship well-being.  Sexual activity.  Eating habits.  Work and work Statistician.  Method of birth control.  Menstrual cycle.  Pregnancy history.  Screening You may have the following tests or measurements:  Height, weight, and BMI.  Blood pressure.  Lipid and  cholesterol levels. These may be checked every 5 years, or more frequently if you are over 42 years old.  Skin check.  Lung cancer screening. You may have this screening every year starting at age 30 if you have a 30-pack-year history of smoking and currently smoke or have quit within the past 15 years.  Fecal occult blood test (FOBT) of the stool. You may have this test every year starting at age 59.  Flexible sigmoidoscopy or colonoscopy. You may have a sigmoidoscopy every 5 years or a colonoscopy every 10 years starting at age 95.  Hepatitis C blood test.  Hepatitis B blood test.  Sexually transmitted disease (STD) testing.  Diabetes screening. This is done by checking your blood sugar (glucose) after you have not eaten for a while (fasting). You may have this done every 1-3 years.  Mammogram. This may be done every 1-2 years. Talk to your health care provider about when you should start having regular mammograms. This may depend on whether you have a family history of breast cancer.  BRCA-related cancer screening. This may be done if you have a family history of breast, ovarian, tubal, or peritoneal cancers.  Pelvic exam and Pap test. This may be done every 3 years starting at age 99. Starting at age 47, this may be done every  5 years if you have a Pap test in combination with an HPV test.  Bone density scan. This is done to screen for osteoporosis. You may have this scan if you are at high risk for osteoporosis.  Discuss your test results, treatment options, and if necessary, the need for more tests with your health care provider. Vaccines Your health care provider may recommend certain vaccines, such as:  Influenza vaccine. This is recommended every year.  Tetanus, diphtheria, and acellular pertussis (Tdap, Td) vaccine. You may need a Td booster every 10 years.  Varicella vaccine. You may need this if you have not been vaccinated.  Zoster vaccine. You may need this after age  65.  Measles, mumps, and rubella (MMR) vaccine. You may need at least one dose of MMR if you were born in 1957 or later. You may also need a second dose.  Pneumococcal 13-valent conjugate (PCV13) vaccine. You may need this if you have certain conditions and were not previously vaccinated.  Pneumococcal polysaccharide (PPSV23) vaccine. You may need one or two doses if you smoke cigarettes or if you have certain conditions.  Meningococcal vaccine. You may need this if you have certain conditions.  Hepatitis A vaccine. You may need this if you have certain conditions or if you travel or work in places where you may be exposed to hepatitis A.  Hepatitis B vaccine. You may need this if you have certain conditions or if you travel or work in places where you may be exposed to hepatitis B.  Haemophilus influenzae type b (Hib) vaccine. You may need this if you have certain conditions.  Talk to your health care provider about which screenings and vaccines you need and how often you need them. This information is not intended to replace advice given to you by your health care provider. Make sure you discuss any questions you have with your health care provider. Document Released: 02/24/2015 Document Revised: 10/18/2015 Document Reviewed: 11/29/2014 Elsevier Interactive Patient Education  2017 [employer].   How to Take Your Blood Pressure Blood pressure is a measurement of how strongly your blood is pressing against the walls of your arteries. Arteries are blood vessels that carry blood from your heart throughout your body. Your health care provider takes your blood pressure at each office visit. You can also take your own blood pressure at home with a blood pressure machine. You may need to take your own blood pressure:  To confirm a diagnosis of high blood pressure (hypertension).  To monitor your blood pressure over time.  To make sure your blood pressure medicine is working.  Supplies  needed: To take your blood pressure, you will need a blood pressure machine. You can buy a blood pressure machine, or blood pressure monitor, at most drugstores or online. There are several types of home blood pressure monitors. When choosing one, consider the following:  Choose a monitor that has an arm cuff.  Choose a monitor that wraps snugly around your upper arm. You should be able to fit only one finger between your arm and the cuff.  Do not choose a monitor that measures your blood pressure from your wrist or finger.  Your health care provider can suggest a reliable monitor that will meet your needs. How to prepare To get the most accurate reading, avoid the following for 30 minutes before you check your blood pressure:  Drinking caffeine.  Drinking alcohol.  Eating.  Smoking.  Exercising.  Five minutes before you check your  blood pressure:  Empty your bladder.  Sit quietly without talking in a dining chair, rather than in a soft couch or armchair.  How to take your blood pressure To check your blood pressure, follow the instructions in the manual that came with your blood pressure monitor. If you have a digital blood pressure monitor, the instructions may be as follows: 1. Sit up straight. 2. Place your feet on the floor. Do not cross your ankles or legs. 3. Rest your left arm at the level of your heart on a table or desk or on the arm of a chair. 4. Pull up your shirt sleeve. 5. Wrap the blood pressure cuff around the upper part of your left arm, 1 inch (2.5 cm) above your elbow. It is best to wrap the cuff around bare skin. 6. Fit the cuff snugly around your arm. You should be able to place only one finger between the cuff and your arm. 7. Position the cord inside the groove of your elbow. 8. Press the power button. 9. Sit quietly while the cuff inflates and deflates. 10. Read the digital reading on the monitor screen and write it down (record it). 11. Wait 2-3  minutes, then repeat the steps, starting at step 1.  What does my blood pressure reading mean? A blood pressure reading consists of a higher number over a lower number. Ideally, your blood pressure should be below 120/80. The first ("top") number is called the systolic pressure. It is a measure of the pressure in your arteries as your heart beats. The second ("bottom") number is called the diastolic pressure. It is a measure of the pressure in your arteries as the heart relaxes. Blood pressure is classified into four stages. The following are the stages for adults who do not have a short-term serious illness or a chronic condition. Systolic pressure and diastolic pressure are measured in a unit called mm Hg. Normal  Systolic pressure: below 825.  Diastolic pressure: below 80. Elevated  Systolic pressure: 003-704.  Diastolic pressure: below 80. Hypertension stage 1  Systolic pressure: 888-916.  Diastolic pressure: 94-50. Hypertension stage 2  Systolic pressure: 388 or above.  Diastolic pressure: 90 or above. You can have prehypertension or hypertension even if only the systolic or only the diastolic number in your reading is higher than normal. Follow these instructions at home:  Check your blood pressure as often as recommended by your health care provider.  Take your monitor to the next appointment with your health care provider to make sure: ? That you are using it correctly. ? That it provides accurate readings.  Be sure you understand what your goal blood pressure numbers are.  Tell your health care provider if you are having any side effects from blood pressure medicine. Contact a health care provider if:  Your blood pressure is consistently high. Get help right away if:  Your systolic blood pressure is higher than 180.  Your diastolic blood pressure is higher than 110. This information is not intended to replace advice given to you by your health care provider. Make  sure you discuss any questions you have with your health care provider. Document Released: 07/07/2015 Document Revised: 09/19/2015 Document Reviewed: 07/07/2015 Elsevier Interactive Patient Education  2018 [employer]. Cadell Gabrielson M.D.

## 2016-11-19 LAB — CBC WITH DIFFERENTIAL/PLATELET
Basophils Absolute: 0 10*3/uL (ref 0.0–0.1)
Basophils Relative: 0.7 % (ref 0.0–3.0)
EOS PCT: 5.3 % — AB (ref 0.0–5.0)
Eosinophils Absolute: 0.3 10*3/uL (ref 0.0–0.7)
HEMATOCRIT: 43 % (ref 36.0–46.0)
HEMOGLOBIN: 14.2 g/dL (ref 12.0–15.0)
LYMPHS PCT: 26.2 % (ref 12.0–46.0)
Lymphs Abs: 1.4 10*3/uL (ref 0.7–4.0)
MCHC: 33 g/dL (ref 30.0–36.0)
MCV: 94.9 fl (ref 78.0–100.0)
MONOS PCT: 6.7 % (ref 3.0–12.0)
Monocytes Absolute: 0.4 10*3/uL (ref 0.1–1.0)
Neutro Abs: 3.3 10*3/uL (ref 1.4–7.7)
Neutrophils Relative %: 61.1 % (ref 43.0–77.0)
Platelets: 289 10*3/uL (ref 150.0–400.0)
RBC: 4.54 Mil/uL (ref 3.87–5.11)
RDW: 13.3 % (ref 11.5–15.5)
WBC: 5.4 10*3/uL (ref 4.0–10.5)

## 2016-11-19 LAB — LIPID PANEL
CHOL/HDL RATIO: 3
CHOLESTEROL: 219 mg/dL — AB (ref 0–200)
HDL: 69.5 mg/dL (ref >39.00)
LDL CALC: 134 mg/dL — AB (ref 0–99)
NonHDL: 149.65
TRIGLYCERIDES: 76 mg/dL (ref 0.0–149.0)
VLDL: 15.2 mg/dL (ref 0.0–40.0)

## 2016-11-19 LAB — BASIC METABOLIC PANEL
BUN: 11 mg/dL (ref 6–23)
CO2: 32 mEq/L (ref 19–32)
Calcium: 8.9 mg/dL (ref 8.4–10.5)
Chloride: 99 mEq/L (ref 96–112)
Creatinine, Ser: 0.56 mg/dL (ref 0.40–1.20)
GFR: 118.5 mL/min (ref >60.00)
Glucose, Bld: 86 mg/dL (ref 70–99)
POTASSIUM: 3.9 meq/L (ref 3.5–5.1)
SODIUM: 139 meq/L (ref 135–145)

## 2016-11-19 LAB — HEPATIC FUNCTION PANEL
ALT: 10 U/L (ref 0–35)
AST: 13 U/L (ref 0–37)
Albumin: 4 g/dL (ref 3.5–5.2)
Alkaline Phosphatase: 80 U/L (ref 39–117)
BILIRUBIN DIRECT: 0.1 mg/dL (ref 0.0–0.3)
BILIRUBIN TOTAL: 0.4 mg/dL (ref 0.2–1.2)
Total Protein: 6.4 g/dL (ref 6.0–8.3)

## 2016-11-19 LAB — TSH: TSH: 0.97 u[IU]/mL (ref 0.35–4.50)

## 2016-11-19 LAB — VITAMIN D 25 HYDROXY (VIT D DEFICIENCY, FRACTURES): VITD: 59.2 ng/mL (ref 30.00–100.00)

## 2016-11-20 ENCOUNTER — Encounter: Payer: Self-pay | Admitting: Gastroenterology

## 2016-11-20 LAB — CYTOLOGY - PAP
ADEQUACY: ABSENT
Diagnosis: NEGATIVE
HPV (WINDOPATH): NOT DETECTED

## 2016-11-20 NOTE — Progress Notes (Signed)
Tell patient PAP is normal. HPV high  risk is negative

## 2016-11-21 ENCOUNTER — Telehealth: Payer: Self-pay | Admitting: Internal Medicine

## 2016-11-21 NOTE — Telephone Encounter (Signed)
Pt came by and brought records for Dr Fabian Sharp to review pertaining to a previous back injury. Copies made and have been placed and placed with appt paperwork for 11/22/16.   Pt also had questions about her BP while she was here and requested a Rx for Norvasc as she has taken this in the past. I advised that Dr Fabian Sharp was not in the office this afternoon bu that I would send a message with this request for 11/22/16 and she refused. Pt also stated that she has not been able to do her BP readings requested by Dr Fabian Sharp d/t her Fibromyalgia and the pain it was causing to her arms. Pt had no BP readings to go off of to confirm that she in fact needs a BP medication. Pt stated that she wanted to be seen today 11/21/16 by another Provider and have something written today instead of waiting. I explained that another provider is not going to be made adjustments to medications without the consent of the regular primary care MD (Dr Fabian Sharp) - Pt was not happy with this decision so I discussed with Amalia Hailey and he agreed that the patient needed to be seen by Dr Fabian Sharp.  Pt was scheduled with Dr Fabian Sharp for 11/22/16 at 145pm ( slot) to discuss BP and getting a new medication - Amalia Hailey spoke with patient as well.   Pt is also requesting a refill of her Proair inhaler, last filled 2016. No notes in recent visit indicated okay to refill today so I advised that we would discuss this tomorrow as well.   Will send to Dr Fabian Sharp as Lorain Childes for tomorrows visit.

## 2016-11-22 ENCOUNTER — Ambulatory Visit (INDEPENDENT_AMBULATORY_CARE_PROVIDER_SITE_OTHER): Payer: BLUE CROSS/BLUE SHIELD | Admitting: Internal Medicine

## 2016-11-22 ENCOUNTER — Encounter: Payer: Self-pay | Admitting: Internal Medicine

## 2016-11-22 VITALS — BP 148/90 | HR 90 | Temp 97.8°F | Wt 122.0 lb

## 2016-11-22 DIAGNOSIS — M47812 Spondylosis without myelopathy or radiculopathy, cervical region: Secondary | ICD-10-CM | POA: Diagnosis not present

## 2016-11-22 DIAGNOSIS — Z79899 Other long term (current) drug therapy: Secondary | ICD-10-CM | POA: Diagnosis not present

## 2016-11-22 DIAGNOSIS — M810 Age-related osteoporosis without current pathological fracture: Secondary | ICD-10-CM | POA: Diagnosis not present

## 2016-11-22 DIAGNOSIS — R030 Elevated blood-pressure reading, without diagnosis of hypertension: Secondary | ICD-10-CM | POA: Diagnosis not present

## 2016-11-22 MED ORDER — ALBUTEROL SULFATE HFA 108 (90 BASE) MCG/ACT IN AERS: INHALATION_SPRAY | RESPIRATORY_TRACT | 2 refills | Status: AC

## 2016-11-22 MED ORDER — AMLODIPINE BESYLATE 5 MG PO TABS: 5.0000 mg | ORAL_TABLET | Freq: Every day | ORAL | 2 refills | Status: DC

## 2016-11-22 NOTE — Progress Notes (Signed)
Chief Complaint  Patient presents with  . Hypertension    ???    HPI: [name] 56 y.o.  sda  See phone message  She is concerned about her blood pressure tends to be 150 and above. Got a machine but it hurts her arm too much and gets bruises when she tries to take it. Might be able to check her readings with irregular machine. Has been on Norvasc in the remote past tolerated well and would like to go back on.  Related to the other issues:    Has some records from  Back [place] and other .   Gave  Reclast. And did a bone density of believe in 2015 T score -2.7 on back she was noted to have degenerative spine disease with anterolisthesis.  She also saw Dr. Modesta Messing at some point.  She's continued back and neck pain and fibromyalgia concerns. She is scheduled to do a colonoscopy.  Would like to go ahead and refill her pro-air inhaler uses inhaler only as needed when allergies are flaring. She gets short of breath not sure if it's asthma.   Says  Allergic     To "everything   "  Sob? And allergy sx.     ROS: See pertinent positives and negatives per HPI. No appetite  And ? Losing wegiht?   Past Medical History:  Diagnosis Date  . Anxiety   . Asthma   . Depression   . Fibromyalgia   . GERD (gastroesophageal reflux disease)   . Headache(784.0)    frequent  . Heart murmur   . Hiatal hernia   . Hypertension   . IBS (irritable bowel syndrome)   . Migraines   . Positive TB test   . Seasonal allergies     Family History  Problem Relation Age of Onset  . Adopted: Yes  . Mental illness Unknown        parent  . Diabetes Unknown        parent     Social History   Social History  . Marital status: Divorced    Spouse name: N/A  . Number of children: N/A  . Years of education: N/A   Occupational History  . RN [employer]   Social History Main Topics  . Smoking status: Former Smoker    Years: 1.00    Types: Cigarettes  . Smokeless tobacco: Never  Used  . Alcohol use Yes     Comment: 2-3/wk  . Drug use: No  . Sexual activity: No   Other Topics Concern  . None   Social History Narrative   Work or School: Engineer, civil (consulting) for [employer]      Home Situation: lives with daughter      Spiritual Beliefs: christian   Lifestyle: no regular exercise, diet ok   1 dog  hh of 1    Divorced  For 12 years     Not working outside  Of home . 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   Both adopted parents died in the same year recently esophageal cancer and bladder cancer.   etoh 1-3 hs    Now  Lives alone   Not working. Fibromyalgia  Limites her  Somewhat?  Outpatient Medications Prior to Visit  Medication Sig Dispense Refill  . Calcium Carbonate-Vitamin D (CALCIUM + D PO) Take by mouth.    . DULoxetine (CYMBALTA) 60 MG capsule Take 120 mg by mouth daily.    Marland Kitchen gabapentin (NEURONTIN) 600 MG tablet 1 tablet six times daily  5  . Magnesium 500 MG TABS Take by mouth.    . naproxen sodium (ANAPROX) 220 MG tablet Taking 2 in the morning and 2 at night    . traZODone (DESYREL) 50 MG tablet Take 50 mg by mouth at bedtime.    Marland Kitchen PROAIR HFA 108 (90 BASE) MCG/ACT inhaler USE 2 PUFFS INTO THE LUNGS EVERY 6 HOURS AS NEEDED 8.5 Inhaler 2   No facility-administered medications prior to visit.      EXAM:  BP (!) 148/90 (BP Location: Left Arm, Patient Position: Sitting, Cuff Size: Normal)   Pulse 90   Temp 97.8 F (36.6 C) (Oral)   Wt 122 lb (55.3 kg)   LMP 01/12/2011   SpO2 96%   BMI 23.83 kg/m   Body mass index is 23.83 kg/m.  GENERAL: vitals reviewed and listed above, alert, oriented, appears well hydrated and in no acute distress HEENT: atraumatic, conjunctiva  clear, no obvious abnormalities on inspection of external nose and ears LUNGS: clear  to auscultation bilaterally, no wheezes, rales or rhonchi, good air movement CV: HRRR, no clubbing cyanosis  MS: moves all extremities without noticeable focal  abnormality PSYCH: alert cognition intact  Looks somewhat depressed  And down  BP Readings from Last 3 Encounters:  11/22/16 (!) 148/90  11/18/16 (!) 160/96  08/09/14 120/84   Wt Readings from Last 3 Encounters:  11/22/16 122 lb (55.3 kg)  11/18/16 123 lb 12.8 oz (56.2 kg)  08/09/14 145 lb 6.4 oz (66 kg)   Lab Results  Component Value Date   WBC 5.4 11/18/2016   HGB 14.2 11/18/2016   HCT 43.0 11/18/2016   PLT 289.0 11/18/2016   GLUCOSE 86 11/18/2016   CHOL 219 (H) 11/18/2016   TRIG 76.0 11/18/2016   HDL 69.50 11/18/2016   LDLDIRECT 102.1 04/09/2012   LDLCALC 134 (H) 11/18/2016   ALT 10 11/18/2016   AST 13 11/18/2016   NA 139 11/18/2016   K 3.9 11/18/2016   CL 99 11/18/2016   CREATININE 0.56 11/18/2016   BUN 11 11/18/2016   CO2 32 11/18/2016   TSH 0.97 11/18/2016   INR 0.9 10/01/2008   HGBA1C 5.2 04/09/2012     ASSESSMENT AND PLAN:  Discussed the following assessment and plan:  Elevated blood pressure reading  Osteoporosis without current pathological fracture, unspecified osteoporosis type - by  dexa 2015 -2.7  - Plan: DG Bone Density  Osteoarthritis of cervical spine, unspecified spinal osteoarthritis complication status  Medication management .     Had reclast 1015    Helped   some back/ neck  pain . Only one infusion  reviewed records  Total visit > 50% spent counseling and coordinating care as indicated in above note and in instructions to patient .  -Patient advised to return or notify health care team  if symptoms worsen ,persist or new concerns arise.  Patient Instructions  Ok to   Begin 5 mg amlodipine  . And finding a way to get  Dash eating plan  Walking is a good exercise and can help also .    I will ofer  A dexa scan at [facility] center   Will be needed  If going  to use reclast but  I am not sure it will help your pain this tie.          DASH Eating Plan DASH stands for "Dietary Approaches to Stop Hypertension." The DASH eating plan is a healthy eating plan that has been shown to reduce high blood pressure (hypertension). It may also reduce your risk for type 2 diabetes, heart disease, and stroke. The DASH eating plan may also help with weight loss. What are tips for following this plan? General guidelines  Avoid eating more than 2,300 mg (milligrams) of salt (sodium) a day. If you have hypertension, you may need to reduce your sodium intake to 1,500 mg a day.  Limit alcohol intake to no more than 1 drink a day for nonpregnant women and 2 drinks a day for men. One drink equals 12 oz of beer, 5 oz of wine, or 1 oz of hard liquor.  Work with your health care provider to maintain a healthy body weight or to lose weight. Ask what an ideal weight is for you.  Get at least 30 minutes of exercise that causes your heart to beat faster (aerobic exercise) most days of the week. Activities may include walking, swimming, or biking.  Work with your health care provider or diet and nutrition specialist (dietitian) to adjust your eating plan to your individual calorie needs. Reading food labels  Check food labels for the amount of sodium per serving. Choose foods with less than 5 percent of the Daily Value of sodium. Generally, foods with less than 300 mg of sodium per serving fit into this eating plan.  To find whole grains, look for the word "whole" as the first word in the ingredient list. Shopping  Buy products labeled as "low-sodium" or "no salt added."  Buy fresh foods. Avoid canned foods and premade or frozen meals. Cooking  Avoid adding salt when cooking. Use salt-free seasonings or herbs instead of table salt or sea salt. Check with your health care provider or pharmacist before using salt substitutes.  Do not fry foods. Cook foods using healthy methods such as  baking, boiling, grilling, and broiling instead.  Cook with heart-healthy oils, such as olive, canola, soybean, or sunflower oil. Meal planning   Eat a balanced diet that includes: ? 5 or more servings of fruits and vegetables each day. At each meal, try to fill half of your plate with fruits and vegetables. ? Up to 6-8 servings of whole grains each day. ? Less than 6 oz of lean meat, poultry, or fish each day. A 3-oz serving of meat is about the same size as a deck of cards. One egg equals 1 oz. ? 2 servings of low-fat dairy each day. ? A serving of nuts, seeds, or beans 5 times each week. ? Heart-healthy fats. Healthy fats called Omega-3 fatty acids are found in foods such as flaxseeds and coldwater fish, like sardines, salmon, and mackerel.  Limit how much you eat of the following: ? Canned or prepackaged foods. ? Food that is high in trans fat, such as fried foods. ? Food that is high in saturated fat, such as fatty meat. ? Sweets, desserts, sugary drinks, and other foods with added sugar. ? Full-fat dairy products.  Do not salt foods before eating.  Try to eat at least 2 vegetarian meals each week.  Eat more home-cooked food and less restaurant, buffet, and fast food.  When eating at a restaurant, ask that your food be  prepared with less salt or no salt, if possible. What foods are recommended? The items listed may not be a complete list. Talk with your dietitian about what dietary choices are best for you. Grains Whole-grain or whole-wheat bread. Whole-grain or whole-wheat pasta. Brown rice. Orpah Cobb. Bulgur. Whole-grain and low-sodium cereals. Pita bread. Low-fat, low-sodium crackers. Whole-wheat flour tortillas. Vegetables Fresh or frozen vegetables (raw, steamed, roasted, or grilled). Low-sodium or reduced-sodium tomato and vegetable juice. Low-sodium or reduced-sodium tomato sauce and tomato paste. Low-sodium or reduced-sodium canned vegetables. Fruits All fresh,  dried, or frozen fruit. Canned fruit in natural juice (without added sugar). Meat and other protein foods Skinless chicken or [place]. Ground chicken or [place]. Pork with fat trimmed off. Fish and seafood. Egg whites. Dried beans, peas, or lentils. Unsalted nuts, nut butters, and seeds. Unsalted canned beans. Lean cuts of beef with fat trimmed off. Low-sodium, lean deli meat. Dairy Low-fat (1%) or fat-free (skim) milk. Fat-free, low-fat, or reduced-fat cheeses. Nonfat, low-sodium ricotta or cottage cheese. Low-fat or nonfat yogurt. Low-fat, low-sodium cheese. Fats and oils Soft margarine without trans fats. Vegetable oil. Low-fat, reduced-fat, or light mayonnaise and salad dressings (reduced-sodium). Canola, safflower, olive, soybean, and sunflower oils. Avocado. Seasoning and other foods Herbs. Spices. Seasoning mixes without salt. Unsalted popcorn and pretzels. Fat-free sweets. What foods are not recommended? The items listed may not be a complete list. Talk with your dietitian about what dietary choices are best for you. Grains Baked goods made with fat, such as croissants, muffins, or some breads. Dry pasta or rice meal packs. Vegetables Creamed or fried vegetables. Vegetables in a cheese sauce. Regular canned vegetables (not low-sodium or reduced-sodium). Regular canned tomato sauce and paste (not low-sodium or reduced-sodium). Regular tomato and vegetable juice (not low-sodium or reduced-sodium). Rosita Fire. Olives. Fruits Canned fruit in a light or heavy syrup. Fried fruit. Fruit in cream or butter sauce. Meat and other protein foods Fatty cuts of meat. Ribs. Fried meat. Tomasa Blase. Sausage. Bologna and other processed lunch meats. Salami. Fatback. Hotdogs. Bratwurst. Salted nuts and seeds. Canned beans with added salt. Canned or smoked fish. Whole eggs or egg yolks. Chicken or [place] with skin. Dairy Whole or 2% milk, cream, and half-and-half. Whole or full-fat cream cheese. Whole-fat or sweetened  yogurt. Full-fat cheese. Nondairy creamers. Whipped toppings. Processed cheese and cheese spreads. Fats and oils Butter. Stick margarine. Lard. Shortening. Ghee. Bacon fat. Tropical oils, such as coconut, palm kernel, or palm oil. Seasoning and other foods Salted popcorn and pretzels. Onion salt, garlic salt, seasoned salt, table salt, and sea salt. Worcestershire sauce. Tartar sauce. Barbecue sauce. Teriyaki sauce. Soy sauce, including reduced-sodium. Steak sauce. Canned and packaged gravies. Fish sauce. Oyster sauce. Cocktail sauce. Horseradish that you find on the shelf. Ketchup. Mustard. Meat flavorings and tenderizers. Bouillon cubes. Hot sauce and Tabasco sauce. Premade or packaged marinades. Premade or packaged taco seasonings. Relishes. Regular salad dressings. Where to find more information:  National Heart, Lung, and Blood Institute: PopSteam.is  American Heart Association: www.heart.org Summary  The DASH eating plan is a healthy eating plan that has been shown to reduce high blood pressure (hypertension). It may also reduce your risk for type 2 diabetes, heart disease, and stroke.  With the DASH eating plan, you should limit salt (sodium) intake to 2,300 mg a day. If you have hypertension, you may need to reduce your sodium intake to 1,500 mg a day.  When on the DASH eating plan, aim to eat more fresh fruits and vegetables, whole grains,  lean proteins, low-fat dairy, and heart-healthy fats.  Work with your health care provider or diet and nutrition specialist (dietitian) to adjust your eating plan to your individual calorie needs. This information is not intended to replace advice given to you by your health care provider. Make sure you discuss any questions you have with your health care provider. Document Released: 01/17/2011 Document Revised: 01/22/2016 Document Reviewed: 01/22/2016 Elsevier Interactive Patient Education  2017 [employer].     [place]. Mashayla Lavin M.D.

## 2016-11-22 NOTE — Patient Instructions (Addendum)
Ok to   Begin 5 mg amlodipine  . And finding a way to get  Dash eating plan  Walking is a good exercise and can help also .    I will ofer  A dexa scan at [facility] center   Will be needed   If going  to use reclast but I am not sure it will help your pain this tie.          DASH Eating Plan DASH stands for "Dietary Approaches to Stop Hypertension." The DASH eating plan is a healthy eating plan that has been shown to reduce high blood pressure (hypertension). It may also reduce your risk for type 2 diabetes, heart disease, and stroke. The DASH eating plan may also help with weight loss. What are tips for following this plan? General guidelines  Avoid eating more than 2,300 mg (milligrams) of salt (sodium) a day. If you have hypertension, you may need to reduce your sodium intake to 1,500 mg a day.  Limit alcohol intake to no more than 1 drink a day for nonpregnant women and 2 drinks a day for men. One drink equals 12 oz of beer, 5 oz of wine, or 1 oz of hard liquor.  Work with your health care provider to maintain a healthy body weight or to lose weight. Ask what an ideal weight is for you.  Get at least 30 minutes of exercise that causes your heart to beat faster (aerobic exercise) most days of the week. Activities may include walking, swimming, or biking.  Work with your health care provider or diet and nutrition specialist (dietitian) to adjust your eating plan to your individual calorie needs. Reading food labels  Check food labels for the amount of sodium per serving. Choose foods with less than 5 percent of the Daily Value of sodium. Generally, foods with less than 300 mg of sodium per serving fit into this eating plan.  To find whole grains, look for the word "whole" as the first word in the ingredient list. Shopping  Buy products labeled as "low-sodium" or "no salt added."  Buy fresh foods. Avoid canned foods and premade or frozen meals. Cooking  Avoid adding salt when  cooking. Use salt-free seasonings or herbs instead of table salt or sea salt. Check with your health care provider or pharmacist before using salt substitutes.  Do not fry foods. Cook foods using healthy methods such as baking, boiling, grilling, and broiling instead.  Cook with heart-healthy oils, such as olive, canola, soybean, or sunflower oil. Meal planning   Eat a balanced diet that includes: ? 5 or more servings of fruits and vegetables each day. At each meal, try to fill half of your plate with fruits and vegetables. ? Up to 6-8 servings of whole grains each day. ? Less than 6 oz of lean meat, poultry, or fish each day. A 3-oz serving of meat is about the same size as a deck of cards. One egg equals 1 oz. ? 2 servings of low-fat dairy each day. ? A serving of nuts, seeds, or beans 5 times each week. ? Heart-healthy fats. Healthy fats called Omega-3 fatty acids are found in foods such as flaxseeds and coldwater fish, like sardines, salmon, and mackerel.  Limit how much you eat of the following: ? Canned or prepackaged foods. ? Food that is high in trans fat, such as fried foods. ? Food that is high in saturated fat, such as fatty meat. ? Sweets, desserts, sugary drinks, and  other foods with added sugar. ? Full-fat dairy products.  Do not salt foods before eating.  Try to eat at least 2 vegetarian meals each week.  Eat more home-cooked food and less restaurant, buffet, and fast food.  When eating at a restaurant, ask that your food be prepared with less salt or no salt, if possible. What foods are recommended? The items listed may not be a complete list. Talk with your dietitian about what dietary choices are best for you. Grains Whole-grain or whole-wheat bread. Whole-grain or whole-wheat pasta. Brown rice. Orpah Cobbatmeal. Quinoa. Bulgur. Whole-grain and low-sodium cereals. Pita bread. Low-fat, low-sodium crackers. Whole-wheat flour tortillas. Vegetables Fresh or frozen vegetables  (raw, steamed, roasted, or grilled). Low-sodium or reduced-sodium tomato and vegetable juice. Low-sodium or reduced-sodium tomato sauce and tomato paste. Low-sodium or reduced-sodium canned vegetables. Fruits All fresh, dried, or frozen fruit. Canned fruit in natural juice (without added sugar). Meat and other protein foods Skinless chicken or [place]. Ground chicken or [place]. Pork with fat trimmed off. Fish and seafood. Egg whites. Dried beans, peas, or lentils. Unsalted nuts, nut butters, and seeds. Unsalted canned beans. Lean cuts of beef with fat trimmed off. Low-sodium, lean deli meat. Dairy Low-fat (1%) or fat-free (skim) milk. Fat-free, low-fat, or reduced-fat cheeses. Nonfat, low-sodium ricotta or cottage cheese. Low-fat or nonfat yogurt. Low-fat, low-sodium cheese. Fats and oils Soft margarine without trans fats. Vegetable oil. Low-fat, reduced-fat, or light mayonnaise and salad dressings (reduced-sodium). Canola, safflower, olive, soybean, and sunflower oils. Avocado. Seasoning and other foods Herbs. Spices. Seasoning mixes without salt. Unsalted popcorn and pretzels. Fat-free sweets. What foods are not recommended? The items listed may not be a complete list. Talk with your dietitian about what dietary choices are best for you. Grains Baked goods made with fat, such as croissants, muffins, or some breads. Dry pasta or rice meal packs. Vegetables Creamed or fried vegetables. Vegetables in a cheese sauce. Regular canned vegetables (not low-sodium or reduced-sodium). Regular canned tomato sauce and paste (not low-sodium or reduced-sodium). Regular tomato and vegetable juice (not low-sodium or reduced-sodium). Rosita FirePickles. Olives. Fruits Canned fruit in a light or heavy syrup. Fried fruit. Fruit in cream or butter sauce. Meat and other protein foods Fatty cuts of meat. Ribs. Fried meat. Tomasa BlaseBacon. Sausage. Bologna and other processed lunch meats. Salami. Fatback. Hotdogs. Bratwurst. Salted nuts  and seeds. Canned beans with added salt. Canned or smoked fish. Whole eggs or egg yolks. Chicken or [place] with skin. Dairy Whole or 2% milk, cream, and half-and-half. Whole or full-fat cream cheese. Whole-fat or sweetened yogurt. Full-fat cheese. Nondairy creamers. Whipped toppings. Processed cheese and cheese spreads. Fats and oils Butter. Stick margarine. Lard. Shortening. Ghee. Bacon fat. Tropical oils, such as coconut, palm kernel, or palm oil. Seasoning and other foods Salted popcorn and pretzels. Onion salt, garlic salt, seasoned salt, table salt, and sea salt. Worcestershire sauce. Tartar sauce. Barbecue sauce. Teriyaki sauce. Soy sauce, including reduced-sodium. Steak sauce. Canned and packaged gravies. Fish sauce. Oyster sauce. Cocktail sauce. Horseradish that you find on the shelf. Ketchup. Mustard. Meat flavorings and tenderizers. Bouillon cubes. Hot sauce and Tabasco sauce. Premade or packaged marinades. Premade or packaged taco seasonings. Relishes. Regular salad dressings. Where to find more information:  National Heart, Lung, and Blood Institute: PopSteam.iswww.nhlbi.nih.gov  American Heart Association: www.heart.org Summary  The DASH eating plan is a healthy eating plan that has been shown to reduce high blood pressure (hypertension). It may also reduce your risk for type 2 diabetes, heart disease, and stroke.  With the DASH eating plan, you should limit salt (sodium) intake to 2,300 mg a day. If you have hypertension, you may need to reduce your sodium intake to 1,500 mg a day.  When on the DASH eating plan, aim to eat more fresh fruits and vegetables, whole grains, lean proteins, low-fat dairy, and heart-healthy fats.  Work with your health care provider or diet and nutrition specialist (dietitian) to adjust your eating plan to your individual calorie needs. This information is not intended to replace advice given to you by your health care provider. Make sure you discuss any questions  you have with your health care provider. Document Released: 01/17/2011 Document Revised: 01/22/2016 Document Reviewed: 01/22/2016 Elsevier Interactive Patient Education  2017 [employer].

## 2016-11-25 ENCOUNTER — Other Ambulatory Visit: Payer: Self-pay | Admitting: Internal Medicine

## 2016-11-25 DIAGNOSIS — Z1231 Encounter for screening mammogram for malignant neoplasm of breast: Secondary | ICD-10-CM

## 2016-11-29 DIAGNOSIS — F331 Major depressive disorder, recurrent, moderate: Secondary | ICD-10-CM | POA: Diagnosis not present

## 2016-12-06 DIAGNOSIS — F4323 Adjustment disorder with mixed anxiety and depressed mood: Secondary | ICD-10-CM | POA: Diagnosis not present

## 2016-12-12 ENCOUNTER — Other Ambulatory Visit: Payer: BLUE CROSS/BLUE SHIELD

## 2016-12-13 ENCOUNTER — Ambulatory Visit
Admission: RE | Admit: 2016-12-13 | Discharge: 2016-12-13 | Disposition: A | Discharge status: Home / Self Care | Payer: BLUE CROSS/BLUE SHIELD | Source: Ambulatory Visit | Attending: Internal Medicine | Admitting: Internal Medicine

## 2016-12-13 DIAGNOSIS — Z780 Asymptomatic menopausal state: Secondary | ICD-10-CM | POA: Diagnosis not present

## 2016-12-13 DIAGNOSIS — Z1231 Encounter for screening mammogram for malignant neoplasm of breast: Secondary | ICD-10-CM | POA: Diagnosis not present

## 2016-12-13 DIAGNOSIS — M810 Age-related osteoporosis without current pathological fracture: Secondary | ICD-10-CM | POA: Diagnosis not present

## 2016-12-18 ENCOUNTER — Other Ambulatory Visit: Payer: Self-pay

## 2016-12-18 DIAGNOSIS — M810 Age-related osteoporosis without current pathological fracture: Secondary | ICD-10-CM

## 2016-12-19 ENCOUNTER — Other Ambulatory Visit: Payer: Self-pay | Admitting: Internal Medicine

## 2016-12-19 DIAGNOSIS — R928 Other abnormal and inconclusive findings on diagnostic imaging of breast: Secondary | ICD-10-CM

## 2016-12-23 DIAGNOSIS — F331 Major depressive disorder, recurrent, moderate: Secondary | ICD-10-CM | POA: Diagnosis not present

## 2016-12-25 ENCOUNTER — Other Ambulatory Visit: Payer: Self-pay | Admitting: Internal Medicine

## 2016-12-25 ENCOUNTER — Ambulatory Visit
Admission: RE | Admit: 2016-12-25 | Discharge: 2016-12-25 | Disposition: A | Discharge status: Home / Self Care | Payer: BLUE CROSS/BLUE SHIELD | Source: Ambulatory Visit | Attending: Internal Medicine | Admitting: Internal Medicine

## 2016-12-25 DIAGNOSIS — N6324 Unspecified lump in the left breast, lower inner quadrant: Secondary | ICD-10-CM | POA: Diagnosis not present

## 2016-12-25 DIAGNOSIS — R928 Other abnormal and inconclusive findings on diagnostic imaging of breast: Secondary | ICD-10-CM

## 2016-12-25 DIAGNOSIS — R922 Inconclusive mammogram: Secondary | ICD-10-CM | POA: Diagnosis not present

## 2016-12-27 DIAGNOSIS — J209 Acute bronchitis, unspecified: Secondary | ICD-10-CM | POA: Diagnosis not present

## 2016-12-27 DIAGNOSIS — J45909 Unspecified asthma, uncomplicated: Secondary | ICD-10-CM | POA: Diagnosis not present

## 2017-01-01 ENCOUNTER — Ambulatory Visit
Admission: RE | Admit: 2017-01-01 | Discharge: 2017-01-01 | Disposition: A | Discharge status: Home / Self Care | Payer: BLUE CROSS/BLUE SHIELD | Source: Ambulatory Visit | Attending: Internal Medicine | Admitting: Internal Medicine

## 2017-01-01 ENCOUNTER — Other Ambulatory Visit: Payer: Self-pay | Admitting: Internal Medicine

## 2017-01-01 DIAGNOSIS — N6324 Unspecified lump in the left breast, lower inner quadrant: Secondary | ICD-10-CM | POA: Diagnosis not present

## 2017-01-01 DIAGNOSIS — R928 Other abnormal and inconclusive findings on diagnostic imaging of breast: Secondary | ICD-10-CM

## 2017-01-01 DIAGNOSIS — N6012 Diffuse cystic mastopathy of left breast: Secondary | ICD-10-CM | POA: Diagnosis not present

## 2017-01-01 DIAGNOSIS — N6320 Unspecified lump in the left breast, unspecified quadrant: Secondary | ICD-10-CM

## 2017-01-07 ENCOUNTER — Ambulatory Visit (AMBULATORY_SURGERY_CENTER): Payer: Self-pay

## 2017-01-07 VITALS — Ht 60.0 in | Wt 123.6 lb

## 2017-01-07 DIAGNOSIS — Z1211 Encounter for screening for malignant neoplasm of colon: Secondary | ICD-10-CM

## 2017-01-07 MED ORDER — NA SULFATE-K SULFATE-MG SULF 17.5-3.13-1.6 GM/177ML PO SOLN: 1.0000 | Freq: Once | ORAL | 0 refills | Status: AC

## 2017-01-07 NOTE — Progress Notes (Signed)
Per pt, no allergies to soy or egg products.Pt not taking any weight loss meds or using  O2 at home.  Pt refused emmi video. 

## 2017-01-10 DIAGNOSIS — F332 Major depressive disorder, recurrent severe without psychotic features: Secondary | ICD-10-CM | POA: Diagnosis not present

## 2017-01-15 ENCOUNTER — Encounter: Payer: Self-pay | Admitting: Gastroenterology

## 2017-01-21 ENCOUNTER — Encounter: Payer: BLUE CROSS/BLUE SHIELD | Admitting: Gastroenterology

## 2017-01-30 ENCOUNTER — Ambulatory Visit (AMBULATORY_SURGERY_CENTER): Payer: BLUE CROSS/BLUE SHIELD | Admitting: Gastroenterology

## 2017-01-30 ENCOUNTER — Encounter: Payer: Self-pay | Admitting: Gastroenterology

## 2017-01-30 ENCOUNTER — Other Ambulatory Visit: Payer: Self-pay

## 2017-01-30 VITALS — BP 112/64 | HR 84 | Temp 98.9°F | Resp 12 | Ht 60.0 in | Wt 123.0 lb

## 2017-01-30 DIAGNOSIS — Z1211 Encounter for screening for malignant neoplasm of colon: Secondary | ICD-10-CM

## 2017-01-30 DIAGNOSIS — Z1212 Encounter for screening for malignant neoplasm of rectum: Secondary | ICD-10-CM | POA: Diagnosis not present

## 2017-01-30 MED ORDER — SODIUM CHLORIDE 0.9 % IV SOLN: 500.0000 mL | Freq: Once | INTRAVENOUS | Status: AC

## 2017-01-30 NOTE — Progress Notes (Signed)
No changes in medical or surgical hx since PV per pt 

## 2017-01-30 NOTE — Patient Instructions (Signed)
Discharge instructions given. Handouts on diverticulosis and hemorrhoids. Resume previous medications. YOU HAD AN ENDOSCOPIC PROCEDURE TODAY AT THE [facility]:   Refer to the procedure report that was given to you for any specific questions about what was found during the examination.  If the procedure report does not answer your questions, please call your gastroenterologist to clarify.  If you requested that your care partner not be given the details of your procedure findings, then the procedure report has been included in a sealed envelope for you to review at your convenience later.  YOU SHOULD EXPECT: Some feelings of bloating in the abdomen. Passage of more gas than usual.  Walking can help get rid of the air that was put into your GI tract during the procedure and reduce the bloating. If you had a lower endoscopy (such as a colonoscopy or flexible sigmoidoscopy) you may notice spotting of blood in your stool or on the toilet paper. If you underwent a bowel prep for your procedure, you may not have a normal bowel movement for a few days.  Please Note:  You might notice some irritation and congestion in your nose or some drainage.  This is from the oxygen used during your procedure.  There is no need for concern and it should clear up in a day or so.  SYMPTOMS TO REPORT IMMEDIATELY:   Following lower endoscopy (colonoscopy or flexible sigmoidoscopy):  Excessive amounts of blood in the stool  Significant tenderness or worsening of abdominal pains  Swelling of the abdomen that is new, acute  Fever of 100F or higher   For urgent or emergent issues, a gastroenterologist can be reached at any hour by calling [phone].   DIET:  We do recommend a small meal at first, but then you may proceed to your regular diet.  Drink plenty of fluids but you should avoid alcoholic beverages for 24 hours.  ACTIVITY:  You should plan to take it easy for the rest of today and you should  NOT DRIVE or use heavy machinery until tomorrow (because of the sedation medicines used during the test).    FOLLOW UP: Our staff will call the number listed on your records the next business day following your procedure to check on you and address any questions or concerns that you may have regarding the information given to you following your procedure. If we do not reach you, we will leave a message.  However, if you are feeling well and you are not experiencing any problems, there is no need to return our call.  We will assume that you have returned to your regular daily activities without incident.  If any biopsies were taken you will be contacted by phone or by letter within the next 1-3 weeks.  Please call us at [phone] if you have not heard about the biopsies in 3 weeks.    SIGNATURES/CONFIDENTIALITY: You and/or your care partner have signed paperwork which will be entered into your electronic medical record.  These signatures attest to the fact that that the information above on your After Visit Summary has been reviewed and is understood.  Full responsibility of the confidentiality of this discharge information lies with you and/or your care-partner. 

## 2017-01-30 NOTE — Progress Notes (Signed)
Report given to PACU, vss 

## 2017-01-30 NOTE — Op Note (Signed)
[facility] Patient Name: Jamie FloridaMary Oneill Procedure Date: 01/30/2017 7:43 AM MRN: [id number] Endoscopist: Napoleon FormKavitha V. Ricard Faulkner , MD Age: 56 Referring MD:  Date of Birth: 12-08-1960 Gender: Female Account #: [id number] Procedure:                Colonoscopy Indications:              Screening for colorectal malignant neoplasm Medicines:                Monitored Anesthesia Care Procedure:                Pre-Anesthesia Assessment:                           - Prior to the procedure, a History and Physical                            was performed, and patient medications and                            allergies were reviewed. The patient's tolerance of                            previous anesthesia was also reviewed. The risks                            and benefits of the procedure and the sedation                            options and risks were discussed with the patient.                            All questions were answered, and informed consent                            was obtained. Prior Anticoagulants: The patient has                            taken no previous anticoagulant or antiplatelet                            agents. ASA Grade Assessment: II - A patient with                            mild systemic disease. After reviewing the risks                            and benefits, the patient was deemed in                            satisfactory condition to undergo the procedure.                           After obtaining informed consent, the colonoscope  was passed under direct vision. Throughout the                            procedure, the patient's blood pressure, pulse, and                            oxygen saturations were monitored continuously. The                            Colonoscope was introduced through the anus and                            advanced to the the terminal ileum, with                            identification of the  appendiceal orifice and IC                            valve. The colonoscopy was performed without                            difficulty. The patient tolerated the procedure                            well. The quality of the bowel preparation was                            excellent. The terminal ileum, ileocecal valve,                            appendiceal orifice, and rectum were photographed. Scope In: 8:14:21 AM Scope Out: 8:26:58 AM Scope Withdrawal Time: 0 hours 8 minutes 51 seconds  Total Procedure Duration: 0 hours 12 minutes 37 seconds  Findings:                 The perianal and digital rectal examinations were                            normal.                           A few small-mouthed diverticula were found in the                            sigmoid colon, descending colon and ascending colon.                           Non-bleeding internal hemorrhoids were found during                            retroflexion. The hemorrhoids were small. Complications:            No immediate complications. Estimated Blood Loss:     Estimated blood loss was minimal. Impression:               - Diverticulosis  in the sigmoid colon, in the                            descending colon and in the ascending colon.                           - Non-bleeding internal hemorrhoids.                           - No specimens collected. Recommendation:           - Patient has a contact number available for                            emergencies. The signs and symptoms of potential                            delayed complications were discussed with the                            patient. Return to normal activities tomorrow.                            Written discharge instructions were provided to the                            patient.                           - Resume previous diet.                           - Continue present medications.                           - Repeat colonoscopy in 10 years  for screening                            purposes. Napoleon FormKavitha V. Thoren Hosang, MD 01/30/2017 8:31:18 AM This report has been signed electronically.

## 2017-01-31 ENCOUNTER — Telehealth: Payer: Self-pay

## 2017-01-31 ENCOUNTER — Telehealth: Payer: Self-pay | Admitting: *Deleted

## 2017-01-31 NOTE — Telephone Encounter (Signed)
   LMOM Alleah Dearman/Recovery 

## 2017-01-31 NOTE — Telephone Encounter (Signed)
  Follow up Call-  Call back number 01/30/2017  Post procedure Call Back phone  # [phone]  Permission to leave phone message Yes  Some recent data might be hidden     Patient questions:  Message left to call us if necessary.

## 2017-02-21 ENCOUNTER — Ambulatory Visit: Payer: BLUE CROSS/BLUE SHIELD | Admitting: Internal Medicine

## 2017-02-21 ENCOUNTER — Other Ambulatory Visit: Payer: Self-pay | Admitting: Internal Medicine

## 2017-03-04 ENCOUNTER — Encounter: Payer: Self-pay | Admitting: Internal Medicine

## 2017-03-04 ENCOUNTER — Ambulatory Visit (INDEPENDENT_AMBULATORY_CARE_PROVIDER_SITE_OTHER): Payer: BLUE CROSS/BLUE SHIELD | Admitting: Internal Medicine

## 2017-03-04 VITALS — BP 130/72 | HR 68 | Temp 97.8°F | Ht 60.5 in | Wt 122.0 lb

## 2017-03-04 DIAGNOSIS — Z8639 Personal history of other endocrine, nutritional and metabolic disease: Secondary | ICD-10-CM | POA: Diagnosis not present

## 2017-03-04 DIAGNOSIS — M810 Age-related osteoporosis without current pathological fracture: Secondary | ICD-10-CM | POA: Insufficient documentation

## 2017-03-04 LAB — BASIC METABOLIC PANEL WITH GFR
BUN: 16 mg/dL (ref 7–25)
CO2: 31 mmol/L (ref 20–32)
Calcium: 9.6 mg/dL (ref 8.6–10.4)
Chloride: 100 mmol/L (ref 98–110)
Creat: 0.78 mg/dL (ref 0.50–1.05)
GFR, Est African American: 98 mL/min/{1.73_m2} (ref >=60)
GFR, Est Non African American: 84 mL/min/{1.73_m2} (ref >=60)
GLUCOSE: 88 mg/dL (ref 65–99)
POTASSIUM: 5.2 mmol/L (ref 3.5–5.3)
SODIUM: 139 mmol/L (ref 135–146)

## 2017-03-04 NOTE — Progress Notes (Signed)
Patient ID: [name], female   DOB: 15-Sep-1960, 57 y.o.   MRN: [id number]    HPI  [name] is a 57 y.o.-year-old female, referred by her PCP, Dr. Fabian Sharp, for management of osteoporosis.  Pt was dx with OP in 2015.  I reviewed pt's DEXA scans: Date L1-L4 T score FN T score  12/13/2016 L1-L3: -2.8 RFN: -1.9 LFN: -2.0 (-2.9%)  09/03/2013 L1-L4: -2.7 LFN: -1.9   She had a foot fracture in 2001. No dizziness/vertigo/orthostasis/poor vision. She had some falls (off balance 2/2 Gabapentin).  She has a history of scoliosis. She has a h/o DDD. Saw Dr. Channing Mutters (orthopedics) >> had a BMD scan >> OP >> got 1 Reclast inj. In 2015, after which he felt great, with less back pain.   + h/o vitamin D deficiency (initially 64). Reviewed available vit D levels: Lab Results  Component Value Date   VD25OH 59.20 11/18/2016   VD25OH 44 06/02/2013   Pt is not on calcium; but she is on vitamin D ~350 units per day.  No weight bearing exercises except walking.  She does not take high vitamin A doses.  Menopause was at early 71s y/o.   Pt does have a FH of osteoporosis: (she is adopted, but half sister has OP).  No hyper/hypocalcemia or hyperparathyroidism. No h/o kidney stones. Lab Results  Component Value Date   CALCIUM 8.9 11/18/2016   CALCIUM 9.4 04/12/2014   CALCIUM 8.9 06/02/2013   CALCIUM 8.4 07/01/2012   CALCIUM 9.2 04/09/2012   CALCIUM 7.6 (L) 10/02/2008   CALCIUM 8.7 10/01/2008   No h/o thyrotoxicosis. Reviewed TSH recent levels:  Lab Results  Component Value Date   TSH 0.97 11/18/2016   TSH 3.10 04/12/2014   TSH 1.16 06/02/2013   No h/o CKD. Last BUN/Cr: Lab Results  Component Value Date   BUN 11 11/18/2016   CREATININE 0.56 11/18/2016   She has fibromyalgia. Takes Gabapentin for this. Off Cymbalta. On Prozac and Wellbutrin.  She also has a history of hypertension.  ROS: Constitutional: no weight gain/loss, + fatigue, no subjective hyperthermia/hypothermia, +  nocturia Eyes: no blurry vision, no xerophthalmia ENT: no sore throat, no nodules palpated in throat, + occasional dysphagia/o no dynophagia, no hoarseness, + tinnitus Cardiovascular: no CP/SOB/palpitations/leg swelling Respiratory: no cough/SOB Gastrointestinal: No nausea or vomiting, + diarrhea, constipation, heartburn Musculoskeletal: + Both: Muscle/joint aches Skin: no rashes, + easy bruising Neurological: no tremors/numbness/tingling/dizziness Psychiatric: + Both: Depression/anxiety  Past Medical History:  Diagnosis Date  . Anxiety   . Asthma   . Complication of anesthesia or sedation in labor and delivery 1991   1 time  . DDD (degenerative disc disease), cervical    and lower back  . Depression   . Fibromyalgia   . GERD (gastroesophageal reflux disease)   . Headache(784.0)    frequent  . Heart murmur   . Hiatal hernia   . Hypertension   . IBS (irritable bowel syndrome)   . Migraines   . Osteoporosis   . 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 Seasonal allergies    environmental allergies and a lot food allergies   Past Surgical History:  Procedure Laterality Date  . CESAREAN SECTION     x 3   . CHOLECYSTECTOMY    . COLONOSCOPY    . UPPER GASTROINTESTINAL ENDOSCOPY     Social History   Socioeconomic History  . Marital status: Divorced, 3 children  Social Needs  Occupational History  . Occupation: Teacher, adult educationN    Employer: [employer]  Tobacco Use  . Smoking status: Former Smoker    Years: 1.00    Types: Cigarettes    Last attempt to quit: 01/08/2007    Years since quitting: 10.1  . Smokeless tobacco: Never Used  Substance and Sexual Activity  . Alcohol use: Yes    Comment: 2-3/wk  . Drug use: No  . Sexual activity: No    Birth control/protection: Surgical        Social History Narrative   Work or School: Engineer, civil (consulting)nurse for [employer]      Home Situation: lives with daughter      Spiritual Beliefs: christian   Lifestyle: no  regular exercise, diet ok   1 dog  hh of 1    Divorced  For 12 years     Not working outside  Of home . 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   Both adopted parents died in the same year recently esophageal cancer and bladder cancer.   etoh 1-3 hs    Now  Lives alone   Not working. Fibromyalgia  Limites her  Somewhat?   Current Outpatient Medications on File Prior to Visit  Medication Sig Dispense Refill  . albuterol (PROAIR HFA) 108 (90 Base) MCG/ACT inhaler USE 2 PUFFS INTO THE LUNGS EVERY 6 HOURS AS NEEDED 8.5 Inhaler 2  . amLODipine (NORVASC) 5 MG tablet TAKE 1 TABLET BY MOUTH EVERY DAY 30 tablet 5  . buPROPion (WELLBUTRIN XL) 150 MG 24 hr tablet Take 150 mg by mouth. Take 2 tablets daily    . Calcium Carbonate-Vitamin D (CALCIUM + D PO) Take by mouth.    . cholecalciferol (VITAMIN D) 1000 units tablet Take 1,000 Units by mouth. Take one capsule twice a week    . FLUoxetine (PROZAC) 40 MG capsule Take by mouth daily.  4  . gabapentin (NEURONTIN) 600 MG tablet 1 tablet six times daily  5  . Magnesium 500 MG TABS Take by mouth.    . naproxen sodium (ANAPROX) 220 MG tablet Taking 2 in the morning and 2 at night    . traZODone (DESYREL) 50 MG tablet Take 50 mg by mouth at bedtime. Take 4 tablets at bedtime     Current Facility-Administered Medications on File Prior to Visit  Medication Dose Route Frequency Provider Last Rate Last Dose  . 0.9 %  sodium chloride infusion  500 mL Intravenous Once Nandigam, Eleonore ChiquitoKavitha V, MD       Allergies  Allergen Reactions  . Hydrocodone Itching    itching   Family History  Adopted: Yes  Problem Relation Age of Onset  . Diabetes Mother   . Mental illness Unknown        parent  . Diabetes Unknown        parent   . Colon cancer Neg Hx   . Esophageal cancer Neg Hx   . Rectal cancer  Neg Hx   . Stomach cancer Neg Hx     PE: BP 130/72   Pulse 68   Temp 97.8 F (36.6 C) (Oral)   Ht 5' 0.5" (1.537 m)   Wt 122 lb (55.3 kg)   LMP 01/12/2011   SpO2 94%   BMI 23.43 kg/m  Wt  Readings from Last 3 Encounters:  03/04/17 122 lb (55.3 kg)  01/30/17 123 lb (55.8 kg)  01/07/17 123 lb 9.6 oz (56.1 kg)   Constitutional: normal weight, in NAD. No kyphosis. Eyes: PERRLA, EOMI, no exophthalmos ENT: moist mucous membranes, no thyromegaly, no cervical lymphadenopathy Cardiovascular: RRR, No MRG Respiratory: CTA B Gastrointestinal: abdomen soft, NT, ND, BS+ Musculoskeletal: no deformities, strength intact in all 4 Skin: moist, warm, no rashes Neurological: no tremor with outstretched hands, DTR normal in all 4  Assessment: 1. Osteoporosis  2. H/o vit D def  Plan: 1. Osteoporosis - likely postmenopausal, she has FH of OP - Discussed about increased risk of fracture, depending on the T score, greatly increased when the T score is lower than -2.5, but it is actually a continuum and -2.5 should not be regarded as an absolute threshold. We reviewed her DEXA scan report together, and I explained that based on the T scores, she has an increased risk for fractures.  The T-scores did not change significantly between 2015 and 2018.  She did have an injection of Reclast in 2015. - we reviewed her dietary and supplemental calcium and vitamin D intake. Her recent vitamin D level was normal, on approximately 350 units daily.  I advised her to continue with this dose. - discussed fall precautions   - given handout from [facility] Re: weight bearing exercises - advised to do this every day or at least 5/7 days - I also recommended the [facility] center - for skeletal loading. Given brochure and explained benefits. - we discussed about maintaining a good amount of protein in her diet. The recommended daily protein intake is ~0.8 g per kilogram per day. Also, avoid  smoking or >2 drinks of alcohol a day. - I recommended the following book - detailing best diet for osteoporosis: Low acid  - We discussed about the different medication classes, benefits and side effects (including atypical fractures and ONJ - no dental workup in progress or planned).  - she is interested in starting medication for osteoporosis and we discussed that my first choice would be sq denosumab (Prolia) for 6-10 years, then zoledronic acid (iv Reclast) for 1-2 years. I would use Teriparatide/Abaloparatide as a last resort. Pt was given reading information about Prolia, and I explained the mechanism of action and expected benefits.  - Will check the following labs today: Orders Placed This Encounter  Procedures  . BASIC METABOLIC PANEL WITH GFR  - if labs normal, will arrange for a Prolia inj - will check a new DEXA scan in 2 years after starting Prolia -  I explained that the first indication that the treatment is working is her not having anymore fractures. DEXA scan changes are secondary: unchanged or slightly higher T-scores are desirable - will see pt back in a year  2. H/o vit D def - on supplementation with OTC vit D (see pb #1) - recent vit D level was great  Office Visit on 03/04/2017  Component Date Value Ref Range Status  . Glucose, Bld 03/04/2017 88  65 - 99 mg/dL Final   Comment: .            Fasting reference interval .   . BUN 03/04/2017 16  7 - 25 mg/dL Final  . Creat 16/11/9602 0.78  0.50 - 1.05 mg/dL Final   Comment: For patients >77 years of age, the reference limit for Creatinine is approximately 13% higher for people identified as African-American. Marland Kitchen   Marland Kitchen  GFR, Est Non African American 03/04/2017 84  > OR = 60 mL/min/1.27m2 Final  . GFR, Est African American 03/04/2017 98  > OR = 60 mL/min/1.85m2 Final  . BUN/Creatinine Ratio 03/04/2017 NOT APPLICABLE  6 - 22 (calc) Final  . Sodium 03/04/2017 139  135 - 146 mmol/L Final  . Potassium 03/04/2017 5.2  3.5 -  5.3 mmol/L Final  . Chloride 03/04/2017 100  98 - 110 mmol/L Final  . CO2 03/04/2017 31  20 - 32 mmol/L Final  . Calcium 03/04/2017 9.6  8.6 - 10.4 mg/dL Final   BMP normal >> will start a PA for Prolia.  Carlus Pavlov, MD PhD [facility]

## 2017-03-04 NOTE — Patient Instructions (Addendum)
We will start the PA for Prolia.  Please come back for a follow-up appointment in 1 year.  How Can I Prevent Falls? Men and women with osteoporosis need to take care not to fall down. Falls can break bones. Some reasons people fall are: Poor vision  Poor balance  Certain diseases that affect how you walk  Some types of medicine, such as sleeping pills.  Some tips to help prevent falls outdoors are: Use a cane or walker  Wear rubber-soled shoes so you don't slip  Walk on grass when sidewalks are slippery  In winter, put salt or kitty litter on icy sidewalks.  Some ways to help prevent falls indoors are: Keep rooms free of clutter, especially on floors  Use plastic or carpet runners on slippery floors  Wear low-heeled shoes that provide good support  Do not walk in socks, stockings, or slippers  Be sure carpets and area rugs have skid-proof backs or are tacked to the floor  Be sure stairs are well lit and have rails on both sides  Put grab bars on bathroom walls near tub, shower, and toilet  Use a rubber bath mat in the shower or tub  Keep a flashlight next to your bed  Use a sturdy step stool with a handrail and wide steps  Add more lights in rooms (and night lights) Buy a cordless phone to keep with you so that you don't have to rush to the phone       when it rings and so that you can call for help if you fall.   (adapted from http://www.niams.[employer].at)  Please check out the following book about best diet for bone health:   Exercise for Strong Bones (from [facility]) There are two types of exercises that are important for building and maintaining bone density:  weight-bearing and muscle-strengthening exercises. Weight-bearing Exercises These exercises include activities that make you move against gravity while staying upright. Weight-bearing exercises can be high-impact or low-impact. High-impact  weight-bearing exercises help build bones and keep them strong. If you have broken a bone due to osteoporosis or are at risk of breaking a bone, you may need to avoid high-impact exercises. If you're not sure, you should check with your healthcare provider. Examples of high-impact weight-bearing exercises are: . Dancing . Doing high-impact aerobics . Hiking . Jogging/running . Jumping Rope . Stair climbing . Tennis Low-impact weight-bearing exercises can also help keep bones strong and are a safe alternative if you cannot do high-impact exercises. Examples of low-impact weight-bearing exercises are: . Using elliptical training machines . Doing low-impact aerobics . Using stair-step machines . Fast walking on a treadmill or outside Muscle-Strengthening Exercises These exercises include activities where you move your body, a weight or some other resistance against gravity. They are also known as resistance exercises and include: . Lifting weights . Using elastic exercise bands . Using weight machines . Lifting your own body weight . Functional movements, such as standing and rising up on your toes Yoga and Pilates can also improve strength, balance and flexibility. However, certain positions may not be safe for people with osteoporosis or those at increased risk of broken bones. For example, exercises that have you bend forward may increase the chance of breaking a bone in the spine. A physical therapist should be able to help you learn which exercises are safe and appropriate for you. Non-Impact Exercises Non-impact exercises can help you to improve balance, posture and how well you move in everyday  activities. These exercises can also help to increase muscle strength and decrease the risk of falls and broken bones. Some of these exercises include: . Balance exercises that strengthen your legs and test your balance, such as Tai Chi, can decrease your risk of falls. . Posture exercises that  improve your posture and reduce rounded or "sloping" shoulders can help you decrease the chance of breaking a bone, especially in the spine. . Functional exercises that improve how well you move can help you with everyday activities and decrease your chance of falling and breaking a bone. For example, if you have trouble getting up from a chair or climbing stairs, you should do these activities as exercises. A physical therapist can teach you balance, posture and functional exercises. Starting a New Exercise Program If you haven't exercised regularly for a while, check with your healthcare provider before beginning a new exercise program--particularly if you have health problems such as heart disease, diabetes or high blood pressure. If you're at high risk of breaking a bone, you should work with a physical therapist to develop a safe exercise program. Once you have your healthcare provider's approval, start slowly. If you've already broken bones in the spine because of osteoporosis, be very careful to avoid activities that require reaching down, bending forward, rapid twisting motions, heavy lifting and those that increase your chance of a fall. As you get started, your muscles may feel sore for a day or two after you exercise. If soreness lasts longer, you may be working too hard and need to ease up. Exercises should be done in a pain-free range of motion. How Much Exercise Do You Need? Weight-bearing exercises 30 minutes on most days of the week. Do a 30-minutesession or multiple sessions spread out throughout the day. The benefits to your bones are the same.   Muscle-strengthening exercises Two to three days per week. If you don't have much time for strengthening/resistance training, do small amounts at a time. You can do just one body part each day. For example do arms one day, legs the next and trunk the next. You can also spread these exercises out during your normal day.  Balance, posture and  functional exercises Every day or as often as needed. You may want to focus on one area more than the others. If you have fallen or lose your balance, spend time doing balance exercises. If you are getting rounded shoulders, work more on posture exercises. If you have trouble climbing stairs or getting up from the couch, do more functional exercises. You can also perform these exercises at one time or spread them during your day. Work with a phyiscal therapist to learn the right exercises for you.   Denosumab: Patient drug information (Up-to-date) Copyright (705)[phone] [employer] All rights reserved.  Brand Names: [place]  ProliaRivka Oneill What is this drug used for?  .It is used to treat soft, brittle bones (osteoporosis).  .It is used for bone growth.  .It is used when treating some cancers.  .It may be given to you for other reasons. Talk with the doctor. What do I need to tell my doctor BEFORE I take this drug?  All products:  .If you have an allergy to denosumab or any other part of this drug.  .If you are allergic to any drugs like this one, any other drugs, foods, or other substances. Tell your doctor about the allergy and what signs you had, like rash; hives; itching; shortness of breath; wheezing; cough;  swelling of face, lips, tongue, or throat; or any other signs.  .If you have low calcium levels.  ProliaT:  .If you are pregnant or may be pregnant. Do not take this drug if you are pregnant.  This is not a list of all drugs or health problems that interact with this drug.  Tell your doctor and pharmacist about all of your drugs (prescription or OTC, natural products, vitamins) and health problems. You must check to make sure that it is safe for you to take this drug with all of your drugs and health problems. Do not start, stop, or change the dose of any drug without checking with your doctor. What are some things I need to know or do while I take this drug?  All products:  .Tell  dentists, surgeons, and other doctors that you use this drug.  .This drug may raise the chance of a broken leg. Talk with your doctor.  .Have your blood work checked. Talk with your doctor.  .Have a bone density test. Talk with your doctor.  .Take calcium and vitamin D as you were told by your doctor.  .Have a dental exam before starting this drug.  .Take good care of your teeth. See a dentist often.  .If you smoke, talk with your doctor.  .Do not give to a child. Talk with your doctor.  .Tell your doctor if you are breast-feeding. You will need to talk about any risks to your baby.  Xgeva:  .This drug may cause harm to the unborn baby if you take it while you are pregnant. If you get pregnant while taking this drug, call your doctor right away.  ProliaT:  .Very bad infections have been reported with use of this drug. If you have any infection, are taking antibiotics now or in the recent past, or have many infections, talk with your doctor.  .You may have more chance of getting an infection. Wash hands often. Stay away from people with infections, colds, or flu.  .Use birth control that you can trust to prevent pregnancy while taking this drug.  .If you are a man and your sex partner is pregnant or gets pregnant at any time while you are being treated, talk with your doctor. What are some side effects that I need to call my doctor about right away?  WARNING/CAUTION: Even though it may be rare, some people may have very bad and sometimes deadly side effects when taking a drug. Tell your doctor or get medical help right away if you have any of the following signs or symptoms that may be related to a very bad side effect:  All products:  .Signs of an allergic reaction, like rash; hives; itching; red, swollen, blistered, or peeling skin with or without fever; wheezing; tightness in the chest or throat; trouble breathing or talking; unusual hoarseness; or swelling of the mouth, face, lips, tongue, or  throat.  .Signs of low calcium levels like muscle cramps or spasms, numbness and tingling, or seizures.  .Mouth sores.  .Any new or strange groin, hip, or thigh pain.  .This drug may cause jawbone problems. The chance may be higher the longer you take this drug. The chance may be higher if you have cancer, dental problems, dentures that do not fit well, anemia, blood clotting problems, or an infection. The chance may also be higher if you are having dental work or if you are getting chemo, some steroid drugs, or radiation. Call your doctor right away if you  have jaw swelling or pain.  Xgeva:  .Not hungry.  .Muscle pain or weakness.  .Seizures.  .Shortness of breath.  ProliaT:  .Signs of infection. These include a fever of 100.26F (38C) or higher, chills, very bad sore throat, ear or sinus pain, cough, more sputum or change in color of sputum, pain with passing urine, mouth sores, wound that will not heal, or anal itching or pain.  .Signs of a pancreas problem (pancreatitis) like very bad stomach pain, very bad back pain, or very bad upset stomach or throwing up.  .Chest pain.  .A heartbeat that does not feel normal.  .Very bad skin irritation.  .Feeling very tired or weak.  .Bladder pain or pain when passing urine or change in how much urine is passed.  .Passing urine often.  .Swelling in the arms or legs. What are some other side effects of this drug?  All drugs may cause side effects. However, many people have no side effects or only have minor side effects. Call your doctor or get medical help if any of these side effects or any other side effects bother you or do not go away:  Xgeva:  .Feeling tired or weak.  Marland KitchenHeadache.  Marland KitchenUpset stomach or throwing up.  .Loose stools (diarrhea).  .Cough.  ProliaT:  .Back pain.  .Muscle or joint pain.  .Sore throat.  .Runny nose.  .Pain in arms or legs.  These are not all of the side effects that may occur. If you have questions about side  effects, call your doctor. Call your doctor for medical advice about side effects.  You may report side effects to your national health agency. How is this drug best taken?  Use this drug as ordered by your doctor. Read and follow the dosing on the label closely.  .It is given as a shot into the fatty part of the skin. What do I do if I miss a dose?  .Call the doctor to find out what to do. How do I store and/or throw out this drug?  Marland KitchenThis drug will be given to you in a hospital or doctor's office. You will not store it at home.  Marland KitchenKeep all drugs out of the reach of children and pets.  .Check with your pharmacist about how to throw out unused drugs.  General drug facts  .If your symptoms or health problems do not get better or if they become worse, call your doctor.  .Do not share your drugs with others and do not take anyone else's drugs.  Marland KitchenKeep a list of all your drugs (prescription, natural products, vitamins, OTC) with you. Give this list to your doctor.  .Talk with the doctor before starting any new drug, including prescription or OTC, natural products, or vitamins.  .Some drugs may have another patient information leaflet. If you have any questions about this drug, please talk with your doctor, pharmacist, or other health care provider.  .If you think there has been an overdose, call your poison control center or get medical care right away. Be ready to tell or show what was taken, how much, and when it happened.

## 2017-03-05 ENCOUNTER — Telehealth: Payer: Self-pay | Admitting: Internal Medicine

## 2017-03-05 NOTE — Telephone Encounter (Signed)
Left detailed message of normal lab results on patient's phone.

## 2017-03-05 NOTE — Telephone Encounter (Signed)
Patient is returning lisa call °

## 2017-03-05 NOTE — Telephone Encounter (Signed)
Pt returning call

## 2017-03-08 DIAGNOSIS — F331 Major depressive disorder, recurrent, moderate: Secondary | ICD-10-CM | POA: Diagnosis not present

## 2017-03-20 ENCOUNTER — Telehealth: Payer: Self-pay | Admitting: Internal Medicine

## 2017-03-20 NOTE — Telephone Encounter (Signed)
Patient has question regarding a shot she  Is supposed to have, please advise. [phone]

## 2017-03-20 NOTE — Telephone Encounter (Signed)
This is in regards to Prolia. Please advise, LVM for pt letting her know.

## 2017-04-08 ENCOUNTER — Telehealth: Payer: Self-pay | Admitting: Internal Medicine

## 2017-04-08 NOTE — Telephone Encounter (Signed)
error 

## 2017-04-08 NOTE — Telephone Encounter (Signed)
Pt is calling back about Prolia. And when she can get this done.     Please advise.

## 2017-04-09 NOTE — Telephone Encounter (Signed)
Left vm letting patient know she has been submitted to the portal

## 2017-04-09 NOTE — Telephone Encounter (Signed)
Patient has been submitted to the portal I am waiting for the SOB to come back

## 2017-04-09 NOTE — Telephone Encounter (Signed)
Patient has been submitted to the portal and I am waiting for SOB to come back

## 2017-05-15 DIAGNOSIS — Z8739 Personal history of other diseases of the musculoskeletal system and connective tissue: Secondary | ICD-10-CM | POA: Diagnosis not present

## 2017-05-15 DIAGNOSIS — H9313 Tinnitus, bilateral: Secondary | ICD-10-CM | POA: Diagnosis not present

## 2017-05-15 DIAGNOSIS — Z23 Encounter for immunization: Secondary | ICD-10-CM | POA: Diagnosis not present

## 2017-06-03 DIAGNOSIS — H903 Sensorineural hearing loss, bilateral: Secondary | ICD-10-CM | POA: Diagnosis not present

## 2017-06-03 DIAGNOSIS — H9313 Tinnitus, bilateral: Secondary | ICD-10-CM | POA: Diagnosis not present

## 2017-06-03 DIAGNOSIS — F3289 Other specified depressive episodes: Secondary | ICD-10-CM | POA: Diagnosis not present

## 2017-06-09 DIAGNOSIS — Z1321 Encounter for screening for nutritional disorder: Secondary | ICD-10-CM | POA: Diagnosis not present

## 2017-06-09 DIAGNOSIS — Z79899 Other long term (current) drug therapy: Secondary | ICD-10-CM | POA: Diagnosis not present

## 2017-06-09 DIAGNOSIS — R29890 Loss of height: Secondary | ICD-10-CM | POA: Diagnosis not present

## 2017-06-09 DIAGNOSIS — Z87891 Personal history of nicotine dependence: Secondary | ICD-10-CM | POA: Diagnosis not present

## 2017-06-09 DIAGNOSIS — M810 Age-related osteoporosis without current pathological fracture: Secondary | ICD-10-CM | POA: Diagnosis not present

## 2017-06-09 DIAGNOSIS — Z8739 Personal history of other diseases of the musculoskeletal system and connective tissue: Secondary | ICD-10-CM | POA: Diagnosis not present

## 2017-06-10 DIAGNOSIS — F332 Major depressive disorder, recurrent severe without psychotic features: Secondary | ICD-10-CM | POA: Diagnosis not present

## 2017-06-19 ENCOUNTER — Telehealth: Payer: Self-pay

## 2017-06-19 ENCOUNTER — Other Ambulatory Visit: Payer: Self-pay

## 2017-06-19 MED ORDER — DENOSUMAB 60 MG/ML ~~LOC~~ SOSY: PREFILLED_SYRINGE | SUBCUTANEOUS | 0 refills | Status: DC

## 2017-06-19 NOTE — Telephone Encounter (Signed)
LVMTCB to schedule prolia injection- patient will need to pick up the prescription from [facility] outpatient [facility]

## 2017-07-17 ENCOUNTER — Telehealth: Payer: Self-pay | Admitting: Emergency Medicine

## 2017-07-17 NOTE — Telephone Encounter (Signed)
This patient has decided to switch doctors and will not be doing Prolia- just FiservFYI

## 2017-07-17 NOTE — Telephone Encounter (Signed)
Error

## 2017-07-17 NOTE — Telephone Encounter (Signed)
Patient called back about Prolia injection. Patient stated she is changing medications and doctors so she no longer needs to schedule appt. Thanks.

## 2017-07-17 NOTE — Telephone Encounter (Signed)
error 

## 2017-07-17 NOTE — Telephone Encounter (Signed)
Noted. Ty!

## 2017-09-04 ENCOUNTER — Other Ambulatory Visit: Payer: Self-pay | Admitting: Internal Medicine

## 2017-09-16 ENCOUNTER — Other Ambulatory Visit: Payer: Self-pay | Admitting: Internal Medicine

## 2017-12-01 ENCOUNTER — Other Ambulatory Visit: Payer: Self-pay | Admitting: Internal Medicine

## 2017-12-01 ENCOUNTER — Other Ambulatory Visit: Payer: Self-pay | Admitting: Nurse Practitioner

## 2017-12-01 DIAGNOSIS — Z1231 Encounter for screening mammogram for malignant neoplasm of breast: Secondary | ICD-10-CM

## 2018-01-12 ENCOUNTER — Ambulatory Visit
Admission: RE | Admit: 2018-01-12 | Discharge: 2018-01-12 | Disposition: A | Discharge status: Home / Self Care | Payer: PRIVATE HEALTH INSURANCE | Source: Ambulatory Visit | Attending: Nurse Practitioner | Admitting: Nurse Practitioner

## 2018-01-12 DIAGNOSIS — Z1231 Encounter for screening mammogram for malignant neoplasm of breast: Secondary | ICD-10-CM

## 2018-03-04 ENCOUNTER — Ambulatory Visit: Payer: BLUE CROSS/BLUE SHIELD | Admitting: Internal Medicine

## 2018-03-04 ENCOUNTER — Telehealth: Payer: Self-pay | Admitting: Internal Medicine

## 2018-03-04 DIAGNOSIS — Z0289 Encounter for other administrative examinations: Secondary | ICD-10-CM

## 2018-03-04 NOTE — Telephone Encounter (Signed)
Patient no showed today's appt. Please advise on how to follow up. °A. No follow up necessary. °B. Follow up urgent. Contact patient immediately. °C. Follow up necessary. Contact patient and schedule visit in ___ days. °D. Follow up advised. Contact patient and schedule visit in ____weeks. ° °Would you like the NS fee to be applied to this visit? ° °

## 2018-03-04 NOTE — Telephone Encounter (Signed)
She is seen in another office for OP now. No f/u necessary.

## 2018-05-14 ENCOUNTER — Other Ambulatory Visit: Payer: Self-pay | Admitting: Internal Medicine

## 2018-05-28 ENCOUNTER — Other Ambulatory Visit: Payer: Self-pay | Admitting: Internal Medicine

## 2018-06-02 NOTE — Telephone Encounter (Signed)
lvm for pt to call back pt will need a virtual appt for med refill

## 2018-06-02 NOTE — Telephone Encounter (Signed)
Pt called stating she is no longer a pt of Dr. Fabian Sharp and does not need medication refilled.

## 2018-08-24 NOTE — Progress Notes (Signed)
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   History of Present Illness: [name] is a 58 y.o. female seen in consultation per request of her PCP.  According to patient she started having generalized pain in her 3120s.  She states in 2004 she came to see me and at the time she was diagnosed with fibromyalgia syndrome.  Since then she has been taking some supplements.  She has been also seeing her psychiatrist for depression.  She states her psychiatrist has prescribed gabapentin and tramadol which is been helpful in managing pain.  She takes tramadol only on PRN basis.  She states without medications her pain level can be anywhere from 5-10 and with medications about 5.  She also does exercise on a regular basis and goes for massage therapy.  She states recently she has been having pain in her left forearm and left thumb.  She was experiencing some discomfort in her right hand as well.  She had x-rays done by her PCP and was told that she is osteoarthritis.  She was also given a brace for her left wrist.  She has been using wrist on regular basis.  She states she was referred to me for further evaluation of her hand pain.  She has some discomfort in her right knee which is been going on for many years.  She denies any joint swelling.  Patient states she was diagnosed with disc disease of cervical and lumbar region long time ago [facility].  She was also seen by Dr. Channing Muttersoy who agreed with disc disease of the lumbar spine.  He diagnosed her with osteoporosis and started her on Reclast.  She is currently on Tymlos.  Activities of Daily Living:  Patient reports morning stiffness for 2 hours.   Patient Reports nocturnal pain.  Difficulty dressing/grooming: Denies  Difficulty climbing stairs: Reports Difficulty getting out of chair: Reports Difficulty using hands for taps, buttons, cutlery, and/or writing: Reports  Review of Systems  Constitutional: Positive for fatigue. Negative for night sweats, weight gain and weight loss.  HENT: Positive for mouth dryness. Negative for mouth sores, trouble swallowing, trouble swallowing and nose dryness.   Eyes: Positive for dryness. Negative for pain, redness and visual disturbance.  Respiratory: Negative for cough, shortness of breath and difficulty breathing.   Cardiovascular: Negative for chest pain, palpitations, hypertension, irregular heartbeat and swelling in legs/feet.  Gastrointestinal: Negative for blood in stool, constipation and diarrhea.  Endocrine: Negative for increased urination.  Genitourinary: Negative for vaginal dryness.  Musculoskeletal: Positive for arthralgias, joint pain, myalgias, morning stiffness and myalgias. Negative for joint swelling, muscle weakness and muscle tenderness.  Skin: Negative for color change, rash, hair loss, skin tightness, ulcers and sensitivity to sunlight.  Allergic/Immunologic: Negative for susceptible to infections.  Neurological: Negative for dizziness, memory loss, night sweats and weakness.  Hematological: Negative for swollen glands.  Psychiatric/Behavioral: Positive for depressed mood and sleep disturbance. The patient is nervous/anxious.     PMFS History:  Patient Active Problem List   Diagnosis Date Noted  . Osteoporosis without current pathological fracture 03/04/2017  . Dysphagia, pharyngoesophageal phase 08/03/2014  .  Colon cancer screening 08/03/2014  . Hiatal hernia 04/05/2014  . Influenza vaccination declined 04/05/2014  . IBS (irritable bowel syndrome) 06/02/2013  . Medication management 06/02/2013  . H/O vitamin D deficiency 06/02/2013  . Depression 06/02/2013  . Degenerative joint disease of cervical spine 06/23/2012  . Degenerative joint  disease of low back 06/23/2012  . GERD (gastroesophageal reflux disease) 06/17/2012  . Asthma, chronic 06/17/2012  . Fibromyalgia 03/15/2011  . Hypertension 03/15/2011    Past Medical History:  Diagnosis Date  . Anxiety   . Asthma   . Complication of anesthesia or sedation in labor and delivery 1991   1 time  . DDD (degenerative disc disease), cervical    and lower back  . Depression   . Fibromyalgia   . GERD (gastroesophageal reflux disease)   . Headache(784.0)    frequent  . Heart murmur   . Hiatal hernia   . Hypertension   . IBS (irritable bowel syndrome)   . Migraines   . Osteoporosis   . 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 Seasonal allergies    environmental allergies and a lot food allergies    Family History  Adopted: Yes  Problem Relation Age of Onset  . Diabetes Mother   . Arthritis Father   . Osteoporosis Sister   . Osteoarthritis Sister   . Healthy Daughter   . Healthy Daughter   . Healthy Daughter   . Colon cancer Neg Hx   . Esophageal cancer Neg Hx   . Rectal cancer Neg Hx   . Stomach cancer Neg Hx   . Breast cancer Neg Hx    Past Surgical History:  Procedure Laterality Date  . BREAST BIOPSY Left 2018  . CESAREAN SECTION     x 3   . CHOLECYSTECTOMY    . COLONOSCOPY    . UPPER GASTROINTESTINAL ENDOSCOPY     Social History   Social History Narrative   Work or School: Engineer, civil (consulting)nurse for advanced homecare      Home Situation: lives with daughter      Spiritual Beliefs: christian   Lifestyle: no regular exercise, diet ok   1 dog  hh of 1    Divorced  For 12 years     Not working outside  Of home . 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   Both adopted parents died in the same year recently esophageal cancer and bladder cancer.   etoh 1-3 hs    Now  Lives alone    Not working. Fibromyalgia  Limites her  Somewhat?                      There is no immunization history on file for this patient.   Objective: Vital Signs: BP 124/84 (BP Location: Right Arm, Patient Position: Sitting, Cuff Size: Normal)   Pulse 93   Resp 12   Ht 5' (1.524 m)   Wt 125 lb (56.7 kg)   LMP 01/12/2011   BMI 24.41 kg/m    Physical Exam Vitals signs and nursing note reviewed.  Constitutional:      Appearance: She is well-developed.  HENT:     Head: Normocephalic and atraumatic.  Eyes:     Conjunctiva/sclera: Conjunctivae normal.  Neck:  Musculoskeletal: Normal range of motion.  Cardiovascular:     Rate and Rhythm: Normal rate and regular rhythm.     Heart sounds: Normal heart sounds.  Pulmonary:     Effort: Pulmonary effort is normal.     Breath sounds: Normal breath sounds.  Abdominal:     General: Bowel sounds are normal.     Palpations: Abdomen is soft.  Lymphadenopathy:     Cervical: No cervical adenopathy.  Skin:    General: Skin is warm and dry.     Capillary Refill: Capillary refill takes less than 2 seconds.  Neurological:     Mental Status: She is alert and oriented to person, place, and time.  Psychiatric:        Behavior: Behavior normal.      Musculoskeletal Exam: C-spine thoracic and lumbar spine with good range of motion with some discomfort.  Shoulder joints, elbow joints, wrist joints with good range of motion.  She had DIP PIP and DIP CMC thickening with some discomfort.  No synovitis was noted.  Hip joints, knee joints, ankles, MTPs PIPs with good range of motion.  She had discomfort range of motion of her right knee joint.  She had positive tender points and hyperalgesia consistent with fibromyalgia.   CDAI Exam: CDAI Score: - Patient Global: -; Provider Global: - Swollen: -; Tender: - Joint Exam   No joint exam has been documented for this visit   There is currently no information documented on the homunculus. Go to the  Rheumatology activity and complete the homunculus joint exam.  Investigation: No additional findings.  Imaging: Xr Knee 3 View Right  Result Date: 09/07/2018 Mild to moderate medial compartment narrowing was noted.  Moderate patellofemoral narrowing was noted.  No chondrocalcinosis was noted. Impression: These findings are consistent with mild to moderate osteoarthritis and moderate chondromalacia patella.   Recent Labs: Lab Results  Component Value Date   WBC 5.4 11/18/2016   HGB 14.2 11/18/2016   PLT 289.0 11/18/2016   NA 139 03/04/2017   K 5.2 03/04/2017   CL 100 03/04/2017   CO2 31 03/04/2017   GLUCOSE 88 03/04/2017   BUN 16 03/04/2017   CREATININE 0.78 03/04/2017   BILITOT 0.4 11/18/2016   ALKPHOS 80 11/18/2016   AST 13 11/18/2016   ALT 10 11/18/2016   PROT 6.4 11/18/2016   ALBUMIN 4.0 11/18/2016   CALCIUM 9.6 03/04/2017   GFRAA 98 03/04/2017    Speciality Comments: No specialty comments available.  Procedures:  No procedures performed Allergies: Hydrocodone   Assessment / Plan:     Visit Diagnoses: Pain in both hands -patient states she has been having pain and discomfort in her bilateral hands.  I do not see any synovitis.  She states she has been using a left wrist brace which has not been helping much.  She had tenderness mostly over her CMC joints.  PIP and DIP thickening was noted.  I will give her prescription for right CMC brace.  Plan: Rheumatoid factor, Uric acid, Cyclic citrul peptide antibody, IgG, Sedimentation rate, patient will bring x-rays of her bilateral hands next visit.  A handout on hand exercises was given.  Chronic pain of right knee -she complains of pain and discomfort in the right knee joint.  She states her right knee joint gives out at times.  No warmth swelling or effusion was noted.  Plan: XR KNEE 3 VIEW RIGHT, x-rays reveal mild to moderate osteoarthritis and moderate chondromalacia patella.  A handout  on knee exercises was given.  She  may use topical diclofenac gel.  Fibromyalgia -she has had history of generalized pain and discomfort for many years.  She states the pain is manageable with gabapentin and tramadol.  DDD (degenerative disc disease), cervical -followed by orthopedics.  DDD (degenerative disc disease), lumbar -she is chronic pain.  Age-related osteoporosis without current pathological fracture - on Tymlos, treated with reclast x1.   H/O vitamin D deficiency -she is on vitamin D supplement.  Other medical problems are listed as follows:  Dysphagia, pharyngoesophageal phase   History of gastroesophageal reflux (GERD)   Hiatal hernia   History of IBS -  Essential hypertension  History of asthma   History of depression   Orders: Orders Placed This Encounter  Procedures  . XR KNEE 3 VIEW RIGHT  . Rheumatoid factor  . Uric acid  . Cyclic citrul peptide antibody, IgG  . Sedimentation rate   No orders of the defined types were placed in this encounter.    Follow-Up Instructions: Return for Osteoarthritis.   Bo Merino, MD  Note - This record has been created using Editor, commissioning.  Chart creation errors have been sought, but may not always  have been located. Such creation errors do not reflect on  the standard of medical care.

## 2018-09-07 ENCOUNTER — Encounter: Payer: Self-pay | Admitting: Rheumatology

## 2018-09-07 ENCOUNTER — Ambulatory Visit (INDEPENDENT_AMBULATORY_CARE_PROVIDER_SITE_OTHER): Payer: PRIVATE HEALTH INSURANCE

## 2018-09-07 ENCOUNTER — Other Ambulatory Visit: Payer: Self-pay

## 2018-09-07 ENCOUNTER — Ambulatory Visit (INDEPENDENT_AMBULATORY_CARE_PROVIDER_SITE_OTHER): Payer: PRIVATE HEALTH INSURANCE | Admitting: Rheumatology

## 2018-09-07 VITALS — BP 124/84 | HR 93 | Resp 12 | Ht 60.0 in | Wt 125.0 lb

## 2018-09-07 DIAGNOSIS — M797 Fibromyalgia: Secondary | ICD-10-CM | POA: Diagnosis not present

## 2018-09-07 DIAGNOSIS — M5030 Other cervical disc degeneration, unspecified cervical region: Secondary | ICD-10-CM | POA: Diagnosis not present

## 2018-09-07 DIAGNOSIS — Z8659 Personal history of other mental and behavioral disorders: Secondary | ICD-10-CM

## 2018-09-07 DIAGNOSIS — Z8639 Personal history of other endocrine, nutritional and metabolic disease: Secondary | ICD-10-CM

## 2018-09-07 DIAGNOSIS — M25561 Pain in right knee: Secondary | ICD-10-CM

## 2018-09-07 DIAGNOSIS — M79641 Pain in right hand: Secondary | ICD-10-CM | POA: Diagnosis not present

## 2018-09-07 DIAGNOSIS — M5136 Other intervertebral disc degeneration, lumbar region: Secondary | ICD-10-CM

## 2018-09-07 DIAGNOSIS — Z8709 Personal history of other diseases of the respiratory system: Secondary | ICD-10-CM

## 2018-09-07 DIAGNOSIS — G8929 Other chronic pain: Secondary | ICD-10-CM

## 2018-09-07 DIAGNOSIS — K449 Diaphragmatic hernia without obstruction or gangrene: Secondary | ICD-10-CM

## 2018-09-07 DIAGNOSIS — R1314 Dysphagia, pharyngoesophageal phase: Secondary | ICD-10-CM

## 2018-09-07 DIAGNOSIS — M810 Age-related osteoporosis without current pathological fracture: Secondary | ICD-10-CM

## 2018-09-07 DIAGNOSIS — M79642 Pain in left hand: Secondary | ICD-10-CM

## 2018-09-07 DIAGNOSIS — I10 Essential (primary) hypertension: Secondary | ICD-10-CM

## 2018-09-07 DIAGNOSIS — Z8719 Personal history of other diseases of the digestive system: Secondary | ICD-10-CM

## 2018-09-07 NOTE — Progress Notes (Signed)
Pharmacy Note  Subjective:  Patient presents today to the [facility] to see Dr. Estanislado Pandy.   Patient seen by the pharmacist for counseling on natural anti-inflammatories and voltaren gel.  Objective: Current Outpatient Medications on File Prior to Visit  Medication Sig Dispense Refill  . Abaloparatide (TYMLOS) 3120 MCG/1.56ML SOPN Inject into the skin daily.    Marland Kitchen albuterol (PROAIR HFA) 108 (90 Base) MCG/ACT inhaler USE 2 PUFFS INTO THE LUNGS EVERY 6 HOURS AS NEEDED 8.5 Inhaler 2  . amLODipine (NORVASC) 5 MG tablet TAKE 1 TABLET BY MOUTH EVERY DAY 90 tablet 0  . Calcium Carbonate-Vitamin D (CALCIUM + D PO) Take by mouth.    . cholecalciferol (VITAMIN D) 1000 units tablet Take 1,000 Units by mouth. Take one capsule twice a week    . gabapentin (NEURONTIN) 600 MG tablet 1 tablet six times daily  5  . Magnesium 500 MG TABS Take by mouth.    . naproxen sodium (ANAPROX) 220 MG tablet Taking 2 in the morning and 2 at night    . traMADol (ULTRAM) 50 MG tablet Take by mouth as needed.    . traZODone (DESYREL) 50 MG tablet Take 50 mg by mouth at bedtime. Take 4 tablets at bedtime    . buPROPion (WELLBUTRIN XL) 150 MG 24 hr tablet Take 150 mg by mouth. Take 2 tablets daily    . FLUoxetine (PROZAC) 40 MG capsule Take by mouth daily.  4   Current Facility-Administered Medications on File Prior to Visit  Medication Dose Route Frequency Provider Last Rate Last Dose  . 0.9 %  sodium chloride infusion  500 mL Intravenous Once Nandigam, Venia Minks, MD         Assessment/Plan:  Counseled on the purpose, proper use, and adverse effects of natural anti-inflammatories including upset stomach and increased bleeding risk.  Encouraged patient to add one medication at a time and to include on medication list to monitor for adverse effects and drug interactions.  Given educational handout with recommended doses.  Patient on the purpose, proper use, and adverse effects of Voltaren gel including  headache, increased blood pressure, and risk of GI bleed.  Instructed patient to avoid applying to open skin wound, or on areas of infection, rash, burn, or peeling skin.  Advised  patient wait at least 10 minutes before dressing or wearing gloves and wait at least 1 hour before you bathe or shower.  Counseled patient to wash hands after application and avoid contact with face/eyes.  Advised patient to apply with q-tip if applying to hands to minimize absorption on palms.  It is now available over the counter.  All questions encouraged and answered.  Instructed patient to call with any further questions or concerns.  Mariella Saa, PharmD, [place], [place] Clinical Specialty Pharmacist [phone]  09/07/2018 10:42 AM

## 2018-09-07 NOTE — Patient Instructions (Signed)
Journal for Nurse Practitioners, 15(4), 263-267. Retrieved November 17, 2017 from [email or web address]">  Knee Exercises Ask your health care provider which exercises are safe for you. Do exercises exactly as told by your health care provider and adjust them as directed. It is normal to feel mild stretching, pulling, tightness, or discomfort as you do these exercises. Stop right away if you feel sudden pain or your pain gets worse. Do not begin these exercises until told by your health care provider. Stretching and range-of-motion exercises These exercises warm up your muscles and joints and improve the movement and flexibility of your knee. These exercises also help to relieve pain and swelling. Knee extension, prone 1. Lie on your abdomen (prone position) on a bed. 2. Place your left / right knee just beyond the edge of the surface so your knee is not on the bed. You can put a towel under your left / right thigh just above your kneecap for comfort. 3. Relax your leg muscles and allow gravity to straighten your knee (extension). You should feel a stretch behind your left / right knee. 4. Hold this position for __________ seconds. 5. Scoot up so your knee is supported between repetitions. Repeat __________ times. Complete this exercise __________ times a day. Knee flexion, active  1. Lie on your back with both legs straight. If this causes back discomfort, bend your left / right knee so your foot is flat on the floor. 2. Slowly slide your left / right heel back toward your buttocks. Stop when you feel a gentle stretch in the front of your knee or thigh (flexion). 3. Hold this position for __________ seconds. 4. Slowly slide your left / right heel back to the starting position. Repeat __________ times. Complete this exercise __________ times a day. Quadriceps stretch, prone  1. Lie on your abdomen on a firm surface, such as a bed or padded floor. 2. Bend your left / right knee and hold  your ankle. If you cannot reach your ankle or pant leg, loop a belt around your foot and grab the belt instead. 3. Gently pull your heel toward your buttocks. Your knee should not slide out to the side. You should feel a stretch in the front of your thigh and knee (quadriceps). 4. Hold this position for __________ seconds. Repeat __________ times. Complete this exercise __________ times a day. Hamstring, supine 1. Lie on your back (supine position). 2. Loop a belt or towel over the ball of your left / right foot. The ball of your foot is on the walking surface, right under your toes. 3. Straighten your left / right knee and slowly pull on the belt to raise your leg until you feel a gentle stretch behind your knee (hamstring). ? Do not let your knee bend while you do this. ? Keep your other leg flat on the floor. 4. Hold this position for __________ seconds. Repeat __________ times. Complete this exercise __________ times a day. Strengthening exercises These exercises build strength and endurance in your knee. Endurance is the ability to use your muscles for a long time, even after they get tired. Quadriceps, isometric This exercise stretches the muscles in front of your thigh (quadriceps) without moving your knee joint (isometric). 1. Lie on your back with your left / right leg extended and your other knee bent. Put a rolled towel or small pillow under your knee if told by your health care provider. 2. Slowly tense the muscles in the front of your left /   right thigh. You should see your kneecap slide up toward your hip or see increased dimpling just above the knee. This motion will push the back of the knee toward the floor. 3. For __________ seconds, hold the muscle as tight as you can without increasing your pain. 4. Relax the muscles slowly and completely. Repeat __________ times. Complete this exercise __________ times a day. Straight leg raises This exercise stretches the muscles in front  of your thigh (quadriceps) and the muscles that move your hips (hip flexors). 1. Lie on your back with your left / right leg extended and your other knee bent. 2. Tense the muscles in the front of your left / right thigh. You should see your kneecap slide up or see increased dimpling just above the knee. Your thigh may even shake a bit. 3. Keep these muscles tight as you raise your leg 4-6 inches (10-15 cm) off the floor. Do not let your knee bend. 4. Hold this position for __________ seconds. 5. Keep these muscles tense as you lower your leg. 6. Relax your muscles slowly and completely after each repetition. Repeat __________ times. Complete this exercise __________ times a day. Hamstring, isometric 1. Lie on your back on a firm surface. 2. Bend your left / right knee about __________ degrees. 3. Dig your left / right heel into the surface as if you are trying to pull it toward your buttocks. Tighten the muscles in the back of your thighs (hamstring) to "dig" as hard as you can without increasing any pain. 4. Hold this position for __________ seconds. 5. Release the tension gradually and allow your muscles to relax completely for __________ seconds after each repetition. Repeat __________ times. Complete this exercise __________ times a day. Hamstring curls If told by your health care provider, do this exercise while wearing ankle weights. Begin with __________ lb weights. Then increase the weight by 1 lb (0.5 kg) increments. Do not wear ankle weights that are more than __________ lb. 1. Lie on your abdomen with your legs straight. 2. Bend your left / right knee as far as you can without feeling pain. Keep your hips flat against the floor. 3. Hold this position for __________ seconds. 4. Slowly lower your leg to the starting position. Repeat __________ times. Complete this exercise __________ times a day. Squats This exercise strengthens the muscles in front of your thigh and knee  (quadriceps). 1. Stand in front of a table, with your feet and knees pointing straight ahead. You may rest your hands on the table for balance but not for support. 2. Slowly bend your knees and lower your hips like you are going to sit in a chair. ? Keep your weight over your heels, not over your toes. ? Keep your lower legs upright so they are parallel with the table legs. ? Do not let your hips go lower than your knees. ? Do not bend lower than told by your health care provider. ? If your knee pain increases, do not bend as low. 3. Hold the squat position for __________ seconds. 4. Slowly push with your legs to return to standing. Do not use your hands to pull yourself to standing. Repeat __________ times. Complete this exercise __________ times a day. Wall slides This exercise strengthens the muscles in front of your thigh and knee (quadriceps). 1. Lean your back against a smooth wall or door, and walk your feet out 18-24 inches (46-61 cm) from it. 2. Place your feet hip-width apart. 3.   Slowly slide down the wall or door until your knees bend __________ degrees. Keep your knees over your heels, not over your toes. Keep your knees in line with your hips. 4. Hold this position for __________ seconds. Repeat __________ times. Complete this exercise __________ times a day. Straight leg raises This exercise strengthens the muscles that rotate the leg at the hip and move it away from your body (hip abductors). 1. Lie on your side with your left / right leg in the top position. Lie so your head, shoulder, knee, and hip line up. You may bend your bottom knee to help you keep your balance. 2. Roll your hips slightly forward so your hips are stacked directly over each other and your left / right knee is facing forward. 3. Leading with your heel, lift your top leg 4-6 inches (10-15 cm). You should feel the muscles in your outer hip lifting. ? Do not let your foot drift forward. ? Do not let your knee  roll toward the ceiling. 4. Hold this position for __________ seconds. 5. Slowly return your leg to the starting position. 6. Let your muscles relax completely after each repetition. Repeat __________ times. Complete this exercise __________ times a day. Straight leg raises This exercise stretches the muscles that move your hips away from the front of the pelvis (hip extensors). 1. Lie on your abdomen on a firm surface. You can put a pillow under your hips if that is more comfortable. 2. Tense the muscles in your buttocks and lift your left / right leg about 4-6 inches (10-15 cm). Keep your knee straight as you lift your leg. 3. Hold this position for __________ seconds. 4. Slowly lower your leg to the starting position. 5. Let your leg relax completely after each repetition. Repeat __________ times. Complete this exercise __________ times a day. This information is not intended to replace advice given to you by your health care provider. Make sure you discuss any questions you have with your health care provider. Document Released: 12/12/2004 Document Revised: 11/18/2017 Document Reviewed: 11/18/2017 [employer] Patient Education  2020 [employer] Hand Exercises Hand exercises can be helpful for almost anyone. These exercises can strengthen the hands, improve flexibility and movement, and increase blood flow to the hands. These results can make work and daily tasks easier. Hand exercises can be especially helpful for people who have joint pain from arthritis or have nerve damage from overuse (carpal tunnel syndrome). These exercises can also help people who have injured a hand. Exercises Most of these hand exercises are gentle stretching and motion exercises. It is usually safe to do them often throughout the day. Warming up your hands before exercise may help to reduce stiffness. You can do this with gentle massage or by placing your hands in warm water for 10-15 minutes. It is normal to feel  some stretching, pulling, tightness, or mild discomfort as you begin new exercises. This will gradually improve. Stop an exercise right away if you feel sudden, severe pain or your pain gets worse. Ask your health care provider which exercises are best for you. Knuckle bend or "claw" fist 6. Stand or sit with your arm, hand, and all five fingers pointed straight up. Make sure to keep your wrist straight during the exercise. 7. Gently bend your fingers down toward your palm until the tips of your fingers are touching the top of your palm. Keep your big knuckle straight and just bend the small knuckles in your fingers. 8. Hold   this position for __________ seconds. 9. Straighten (extend) your fingers back to the starting position. Repeat this exercise 5-10 times with each hand. Full finger fist 5. Stand or sit with your arm, hand, and all five fingers pointed straight up. Make sure to keep your wrist straight during the exercise. 6. Gently bend your fingers into your palm until the tips of your fingers are touching the middle of your palm. 7. Hold this position for __________ seconds. 8. Extend your fingers back to the starting position, stretching every joint fully. Repeat this exercise 5-10 times with each hand. Straight fist 5. Stand or sit with your arm, hand, and all five fingers pointed straight up. Make sure to keep your wrist straight during the exercise. 6. Gently bend your fingers at the big knuckle, where your fingers meet your hand, and the middle knuckle. Keep the knuckle at the tips of your fingers straight and try to touch the bottom of your palm. 7. Hold this position for __________ seconds. 8. Extend your fingers back to the starting position, stretching every joint fully. Repeat this exercise 5-10 times with each hand. Tabletop 1. Stand or sit with your arm, hand, and all five fingers pointed straight up. Make sure to keep your wrist straight during the exercise. 2. Gently bend  your fingers at the big knuckle, where your fingers meet your hand, as far down as you can while keeping the small knuckles in your fingers straight. Think of forming a tabletop with your fingers. 3. Hold this position for __________ seconds. 4. Extend your fingers back to the starting position, stretching every joint fully. Repeat this exercise 5-10 times with each hand. Finger spread 5. Place your hand flat on a table with your palm facing down. Make sure your wrist stays straight as you do this exercise. 6. Spread your fingers and thumb apart from each other as far as you can until you feel a gentle stretch. Hold this position for __________ seconds. 7. Bring your fingers and thumb tight together again. Hold this position for __________ seconds. Repeat this exercise 5-10 times with each hand. Making circles 7. Stand or sit with your arm, hand, and all five fingers pointed straight up. Make sure to keep your wrist straight during the exercise. 8. Make a circle by touching the tip of your thumb to the tip of your index finger. 9. Hold for __________ seconds. Then open your hand wide. 10. Repeat this motion with your thumb and each finger on your hand. Repeat this exercise 5-10 times with each hand. Thumb motion 6. Sit with your forearm resting on a table and your wrist straight. Your thumb should be facing up toward the ceiling. Keep your fingers relaxed as you move your thumb. 7. Lift your thumb up as high as you can toward the ceiling. Hold for __________ seconds. 8. Bend your thumb across your palm as far as you can, reaching the tip of your thumb for the small finger (pinkie) side of your palm. Hold for __________ seconds. Repeat this exercise 5-10 times with each hand. Grip strengthening  5. Hold a stress ball or other soft ball in the middle of your hand. 6. Slowly increase the pressure, squeezing the ball as much as you can without causing pain. Think of bringing the tips of your  fingers into the middle of your palm. All of your finger joints should bend when doing this exercise. 7. Hold your squeeze for __________ seconds, then relax. Repeat this exercise 5-10   times with each hand. Contact a health care provider if:  Your hand pain or discomfort gets much worse when you do an exercise.  Your hand pain or discomfort does not improve within 2 hours after you exercise. If you have any of these problems, stop doing these exercises right away. Do not do them again unless your health care provider says that you can. Get help right away if:  You develop sudden, severe hand pain or swelling. If this happens, stop doing these exercises right away. Do not do them again unless your health care provider says that you can. This information is not intended to replace advice given to you by your health care provider. Make sure you discuss any questions you have with your health care provider. Document Released: 01/09/2015 Document Revised: 05/21/2018 Document Reviewed: 01/29/2018 [employer] Patient Education  2020 [employer]  

## 2018-09-08 LAB — SEDIMENTATION RATE: Sed Rate: 6 mm/h (ref 0–30)

## 2018-09-08 LAB — URIC ACID: Uric Acid, Serum: 4.7 mg/dL (ref 2.5–7.0)

## 2018-09-08 LAB — CYCLIC CITRUL PEPTIDE ANTIBODY, IGG: Cyclic Citrullin Peptide Ab: <16 UNITS

## 2018-09-08 LAB — RHEUMATOID FACTOR: Rheumatoid fact SerPl-aCnc: <14 IU/mL (ref <14)

## 2018-09-18 NOTE — Progress Notes (Signed)
Office Visit Note  Patient: [name]             Date of Birth: 1960/07/28           MRN: [id number]             PCP: Vicenta Aly, [place] Referring: Vicenta Aly, FNP Visit Date: 09/29/2018 Occupation: '@GUAROCC'$ @  Subjective:  Pain in both hands.   History of Present Illness: [name] is a 58 y.o. female with history of osteoarthritis.  She states she continues to have pain and discomfort in her both hands.  She states she tried some braces for her [facility] joint but they were big for her hand.  She has significant morning stiffness.  She has been using diclofenac gel and is also using some over-the-counter topical gels.  She continues to have some discomfort in her cervical and lower spine which is tolerable.  She has been using Tymlos for osteoporosis.  Activities of Daily Living:  Patient reports morning stiffness for several hours.   Patient Reports nocturnal pain.  Difficulty dressing/grooming: Reports Difficulty climbing stairs: Denies Difficulty getting out of chair: Denies Difficulty using hands for taps, buttons, cutlery, and/or writing: Reports  Review of Systems  Constitutional: Positive for fatigue. Negative for night sweats, weight gain and weight loss.  HENT: Positive for mouth dryness. Negative for mouth sores, trouble swallowing, trouble swallowing and nose dryness.   Eyes: Positive for dryness. Negative for pain, redness, itching and visual disturbance.  Respiratory: Negative for cough, shortness of breath, wheezing and difficulty breathing.   Cardiovascular: Negative for chest pain, palpitations, hypertension, irregular heartbeat and swelling in legs/feet.  Gastrointestinal: Negative for abdominal pain, blood in stool, constipation and diarrhea.  Endocrine: Negative for cold intolerance and increased urination.  Genitourinary: Negative for painful urination and vaginal dryness.  Musculoskeletal: Positive for arthralgias, joint pain, morning stiffness  and muscle tenderness. Negative for joint swelling, myalgias, muscle weakness and myalgias.  Skin: Negative for color change, rash, hair loss, redness, skin tightness, ulcers and sensitivity to sunlight.  Allergic/Immunologic: Negative for susceptible to infections.  Neurological: Negative for dizziness, light-headedness, numbness, headaches, memory loss, night sweats and weakness.  Hematological: Negative for bruising/bleeding tendency and swollen glands.  Psychiatric/Behavioral: Positive for depressed mood and sleep disturbance. Negative for confusion. The patient is nervous/anxious.     PMFS History:  Patient Active Problem List   Diagnosis Date Noted   Osteoporosis without current pathological fracture 03/04/2017   Dysphagia, pharyngoesophageal phase 08/03/2014   Colon cancer screening 08/03/2014   Hiatal hernia 04/05/2014   Influenza vaccination declined 04/05/2014   IBS (irritable bowel syndrome) 06/02/2013   Medication management 06/02/2013   H/O vitamin D deficiency 06/02/2013   Depression 06/02/2013   Degenerative joint disease of cervical spine 06/23/2012   Degenerative joint disease of low back 06/23/2012   GERD (gastroesophageal reflux disease) 06/17/2012   Asthma, chronic 06/17/2012   Fibromyalgia 03/15/2011   Hypertension 03/15/2011    Past Medical History:  Diagnosis Date   Anxiety    Asthma    Complication of anesthesia or sedation in labor and delivery 1991   1 time   DDD (degenerative disc disease), cervical    and lower back   Depression    Fibromyalgia    GERD (gastroesophageal reflux disease)    Headache(784.0)    frequent   Heart murmur    Hiatal hernia    Hypertension    IBS (irritable bowel syndrome)    Migraines  Osteoporosis    Positive TB test 2001   per pt/ not sure what happened   Seasonal allergies    environmental allergies and a lot food allergies    Family History  Adopted: Yes  Problem Relation Age  of Onset   Diabetes Mother    Arthritis Father    Osteoporosis Sister    Osteoarthritis Sister    Healthy Daughter    Healthy Daughter    Healthy Daughter    Colon cancer Neg Hx    Esophageal cancer Neg Hx    Rectal cancer Neg Hx    Stomach cancer Neg Hx    Breast cancer Neg Hx    Past Surgical History:  Procedure Laterality Date   BREAST BIOPSY Left 2018   CESAREAN SECTION     x 3    CHOLECYSTECTOMY     COLONOSCOPY     UPPER GASTROINTESTINAL ENDOSCOPY     Social History   Social History Narrative   Work or School: Marine scientist for [employer]      Home Situation: lives with daughter      Spiritual Beliefs: christian   Lifestyle: no regular exercise, diet ok   1 dog  hh of 1    Divorced  For 12 years     Not working outside  Of home . 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   Both adopted parents died in the same year recently esophageal cancer and bladder cancer.   etoh 1-3 hs    Now  Lives alone   Not working. Fibromyalgia  Limites her  Somewhat?                      There is no immunization history on file for this patient.   Objective: Vital Signs: BP 130/76 (BP Location: Left Arm, Patient Position: Sitting, Cuff Size: Normal)    Pulse 89    Resp 13    Ht 5' (1.524 m)    Wt 128 lb 4.8 oz (58.2 kg)    LMP 01/12/2011    BMI 25.06 kg/m    Physical Exam Vitals signs and nursing note reviewed.  Constitutional:      Appearance: She is well-developed.  HENT:     Head: Normocephalic and atraumatic.  Eyes:     Conjunctiva/sclera: Conjunctivae normal.  Neck:     Musculoskeletal: Normal range of motion.  Cardiovascular:     Rate and Rhythm: Normal rate and regular rhythm.     Heart sounds: Normal heart sounds.  Pulmonary:     Effort: Pulmonary effort is normal.     Breath  sounds: Normal breath sounds.  Abdominal:     General: Bowel sounds are normal.     Palpations: Abdomen is soft.  Lymphadenopathy:     Cervical: No cervical adenopathy.  Skin:    General: Skin is warm and dry.     Capillary Refill: Capillary refill takes less than 2 seconds.  Neurological:     Mental Status: She is alert and oriented to person, place, and time.  Psychiatric:        Behavior: Behavior normal.      Musculoskeletal Exam: C-spine and thoracic spine were in good range of motion without discomfort.  Shoulder joints, elbow joints, wrist joints with good  range of motion.  She has thickening of bilateral PIP and DIP joints especially her DIP joints with discomfort.  No synovitis was noted.  Hip joints, knee joints, ankles, MTPs and PIPs with good range of motion with no synovitis.  CDAI Exam: CDAI Score: -- Patient Global: --; Provider Global: -- Swollen: --; Tender: -- Joint Exam   No joint exam has been documented for this visit   There is currently no information documented on the homunculus. Go to the Rheumatology activity and complete the homunculus joint exam.  Investigation: No additional findings.  Imaging: Xr Knee 3 View Right  Result Date: 09/07/2018 Mild to moderate medial compartment narrowing was noted.  Moderate patellofemoral narrowing was noted.  No chondrocalcinosis was noted. Impression: These findings are consistent with mild to moderate osteoarthritis and moderate chondromalacia patella.   Recent Labs: Lab Results  Component Value Date   WBC 5.4 11/18/2016   HGB 14.2 11/18/2016   PLT 289.0 11/18/2016   NA 139 03/04/2017   K 5.2 03/04/2017   CL 100 03/04/2017   CO2 31 03/04/2017   GLUCOSE 88 03/04/2017   BUN 16 03/04/2017   CREATININE 0.78 03/04/2017   BILITOT 0.4 11/18/2016   ALKPHOS 80 11/18/2016   AST 13 11/18/2016   ALT 10 11/18/2016   PROT 6.4 11/18/2016   ALBUMIN 4.0 11/18/2016   CALCIUM 9.6 03/04/2017   GFRAA 98 03/04/2017   September 07, 2018 uric acid 4.7, RF negative, anti-CCP negative, ESR 6  Speciality Comments: Osteoporosis managed by another provider-ACY 09/28/2018  Procedures:  No procedures performed Allergies: Hydrocodone   Assessment / Plan:     Visit Diagnoses: Primary osteoarthritis of both hands - A prescription for right CMC brace was given at the last visit.  Patient could not find a good fitting brace so far.  I gave her a few options.  Joint protection was discussed.  She states she has been doing exercises.  She is using topical gel.  A list of natural anti-inflammatories was given.  Joint protection was again emphasized.  DDD (degenerative disc disease), cervical -she is currently not having much discomfort.  DDD (degenerative disc disease), lumbar -she has pain off and on.  Core strengthening was discussed.  Fibromyalgia -she has generalized pain and discomfort from fibromyalgia.  Age-related osteoporosis without current pathological fracture -she goes to the osteoporosis clinic in [place] and is on Tymlos.  H/O vitamin D deficiency -she is on supplement.  Other medical problems are listed as follows:  History of gastroesophageal reflux (GERD)   Hiatal hernia   Dysphagia, pharyngoesophageal phase   History of IBS  Essential hypertension   History of asthma  History of depression   Orders: No orders of the defined types were placed in this encounter.  No orders of the defined types were placed in this encounter.     Follow-Up Instructions: Return in about 1 year (around 09/29/2019), or if symptoms worsen or fail to improve, for Osteoarthritis, Osteoporosis.   Bo Merino, MD  Note - This record has been created using Editor, commissioning.  Chart creation errors have been sought, but may not always  have been located. Such creation errors do not reflect on  the standard of medical care.

## 2018-09-29 ENCOUNTER — Ambulatory Visit (INDEPENDENT_AMBULATORY_CARE_PROVIDER_SITE_OTHER): Payer: PRIVATE HEALTH INSURANCE | Admitting: Rheumatology

## 2018-09-29 ENCOUNTER — Encounter: Payer: Self-pay | Admitting: Rheumatology

## 2018-09-29 ENCOUNTER — Other Ambulatory Visit: Payer: Self-pay

## 2018-09-29 VITALS — BP 130/76 | HR 89 | Resp 13 | Ht 60.0 in | Wt 128.3 lb

## 2018-09-29 DIAGNOSIS — M51369 Other intervertebral disc degeneration, lumbar region without mention of lumbar back pain or lower extremity pain: Secondary | ICD-10-CM

## 2018-09-29 DIAGNOSIS — M5030 Other cervical disc degeneration, unspecified cervical region: Secondary | ICD-10-CM

## 2018-09-29 DIAGNOSIS — M810 Age-related osteoporosis without current pathological fracture: Secondary | ICD-10-CM

## 2018-09-29 DIAGNOSIS — Z8719 Personal history of other diseases of the digestive system: Secondary | ICD-10-CM

## 2018-09-29 DIAGNOSIS — R1314 Dysphagia, pharyngoesophageal phase: Secondary | ICD-10-CM

## 2018-09-29 DIAGNOSIS — M19041 Primary osteoarthritis, right hand: Secondary | ICD-10-CM

## 2018-09-29 DIAGNOSIS — M5136 Other intervertebral disc degeneration, lumbar region: Secondary | ICD-10-CM

## 2018-09-29 DIAGNOSIS — M797 Fibromyalgia: Secondary | ICD-10-CM

## 2018-09-29 DIAGNOSIS — Z8709 Personal history of other diseases of the respiratory system: Secondary | ICD-10-CM

## 2018-09-29 DIAGNOSIS — Z8639 Personal history of other endocrine, nutritional and metabolic disease: Secondary | ICD-10-CM

## 2018-09-29 DIAGNOSIS — Z8659 Personal history of other mental and behavioral disorders: Secondary | ICD-10-CM

## 2018-09-29 DIAGNOSIS — M19042 Primary osteoarthritis, left hand: Secondary | ICD-10-CM

## 2018-09-29 DIAGNOSIS — K449 Diaphragmatic hernia without obstruction or gangrene: Secondary | ICD-10-CM

## 2018-09-29 DIAGNOSIS — I10 Essential (primary) hypertension: Secondary | ICD-10-CM

## 2018-10-08 IMAGING — MG 2D DIGITAL SCREENING BILATERAL MAMMOGRAM WITH CAD AND ADJUNCT TO
8 of 12 series · 8 of 28 positions shown · non-contrast
Comparison: No prior films.

CLINICAL DATA: Screening.

EXAM:
2D DIGITAL SCREENING BILATERAL MAMMOGRAM WITH CAD AND ADJUNCT TOMO

[R MLO synth-2D]
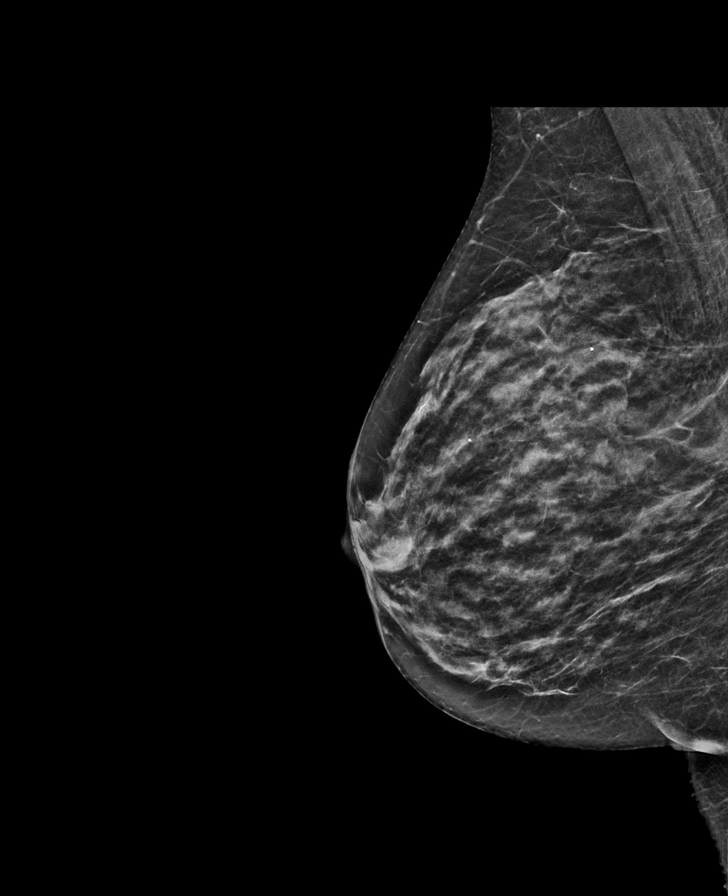

[L MLO]
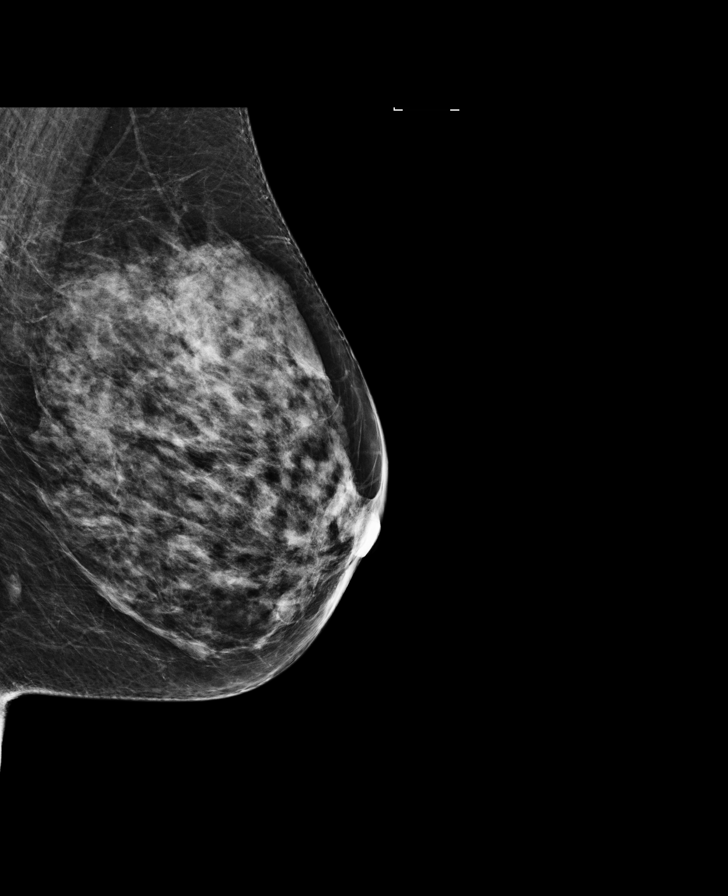

[R CC synth-2D]
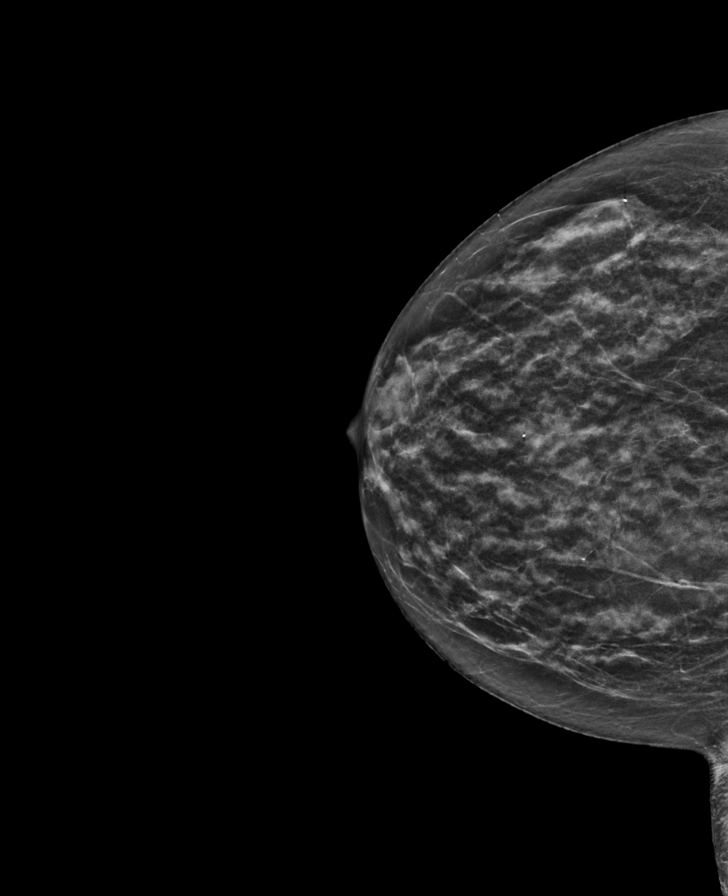

[L CC synth-2D]
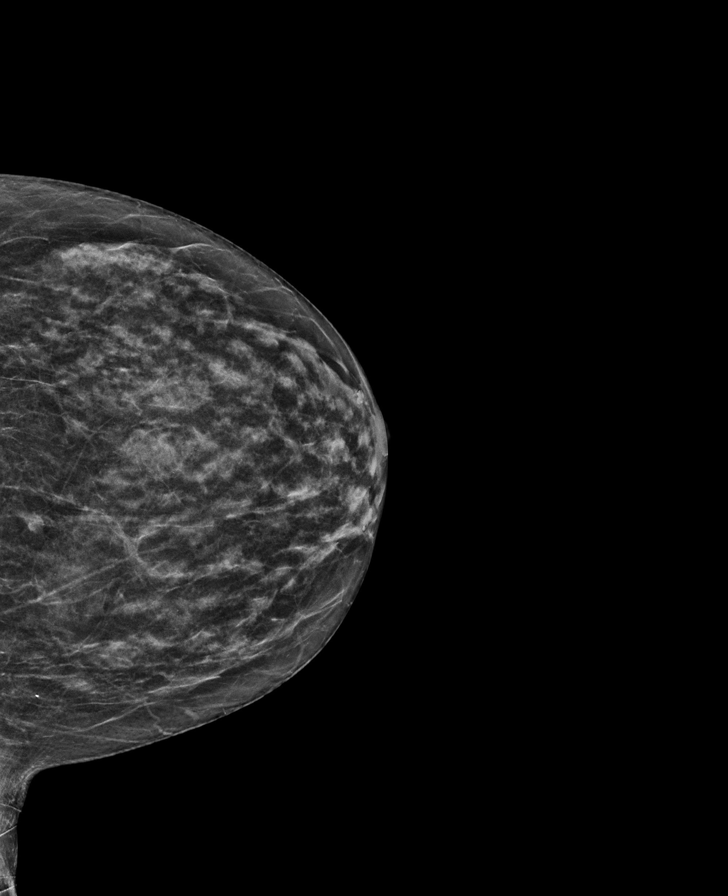

[R MLO]
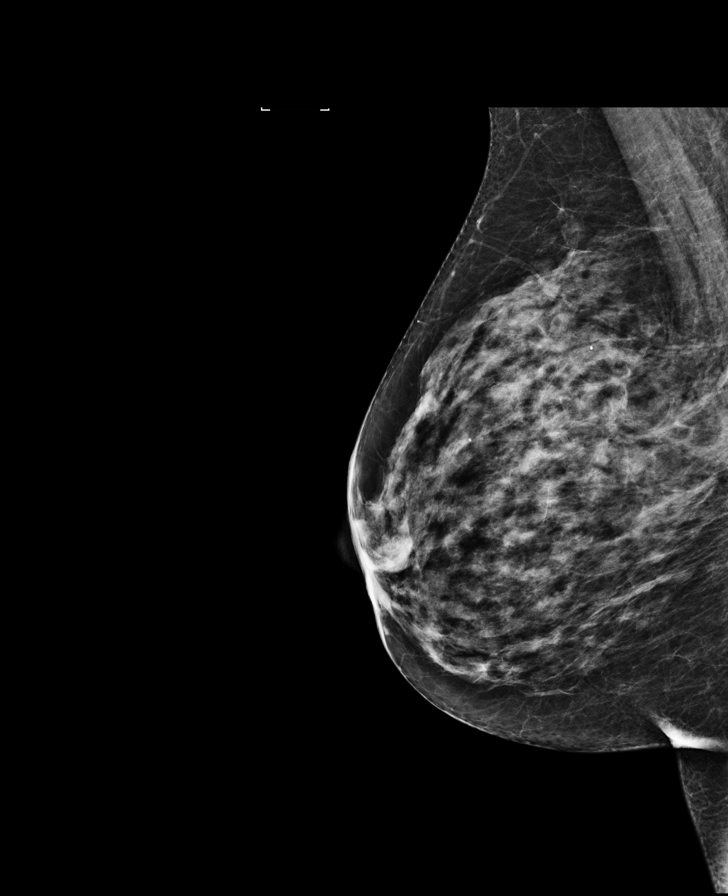

[R CC]
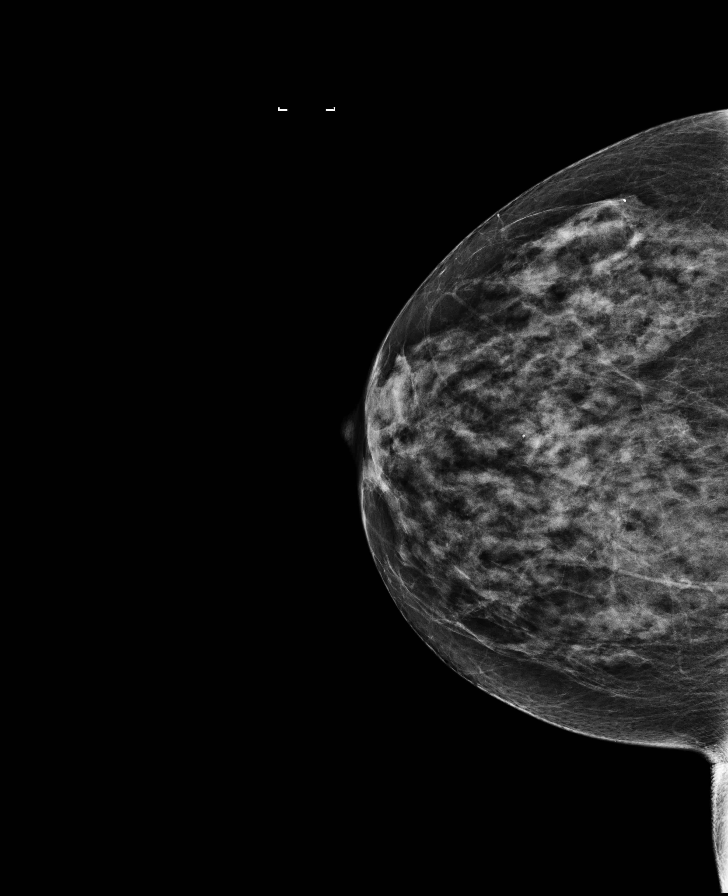

[L MLO synth-2D]
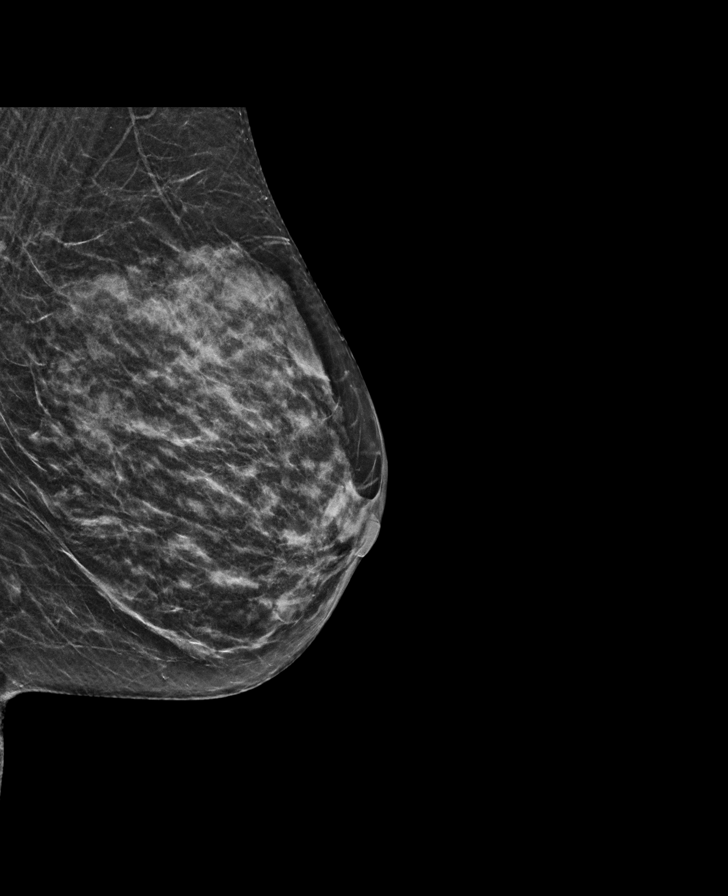

[L CC]
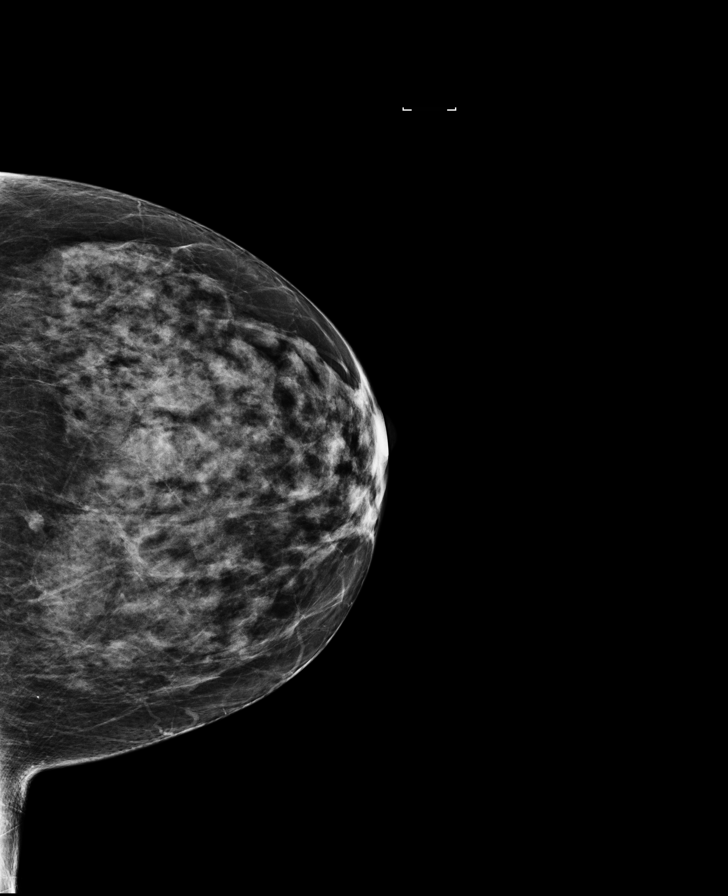

[8 of 28 positions shown; findings below may reference images not displayed]

Patient's prior films have been purged.

ACR Breast Density Category d: The breast tissue is extremely dense,
which lowers the sensitivity of mammography.
FINDINGS: In the left breast, a possible mass warrants further evaluation. In
the right breast, no findings suspicious for malignancy.

Images were processed with CAD.
IMPRESSION: Further evaluation is suggested for possible mass in the left
breast.

RECOMMENDATION:
Diagnostic mammogram and possibly ultrasound of the left breast.
(Code:C6-3-00K)

The patient will be contacted regarding the findings, and additional
imaging will be scheduled.

BI-RADS CATEGORY  0: Incomplete. Need additional imaging evaluation
and/or prior mammograms for comparison.

## 2018-11-24 ENCOUNTER — Other Ambulatory Visit: Payer: Self-pay | Admitting: Nurse Practitioner

## 2018-11-24 DIAGNOSIS — M810 Age-related osteoporosis without current pathological fracture: Secondary | ICD-10-CM

## 2018-12-24 ENCOUNTER — Other Ambulatory Visit: Payer: Self-pay | Admitting: Nurse Practitioner

## 2018-12-24 DIAGNOSIS — Z1231 Encounter for screening mammogram for malignant neoplasm of breast: Secondary | ICD-10-CM

## 2019-02-24 DIAGNOSIS — M1812 Unilateral primary osteoarthritis of first carpometacarpal joint, left hand: Secondary | ICD-10-CM | POA: Diagnosis not present

## 2019-03-09 DIAGNOSIS — Z8781 Personal history of (healed) traumatic fracture: Secondary | ICD-10-CM | POA: Diagnosis not present

## 2019-03-09 DIAGNOSIS — Z87891 Personal history of nicotine dependence: Secondary | ICD-10-CM | POA: Diagnosis not present

## 2019-03-09 DIAGNOSIS — M810 Age-related osteoporosis without current pathological fracture: Secondary | ICD-10-CM | POA: Diagnosis not present

## 2019-03-09 DIAGNOSIS — E559 Vitamin D deficiency, unspecified: Secondary | ICD-10-CM | POA: Diagnosis not present

## 2019-03-09 DIAGNOSIS — Z79899 Other long term (current) drug therapy: Secondary | ICD-10-CM | POA: Diagnosis not present

## 2019-03-17 ENCOUNTER — Ambulatory Visit
Admission: RE | Admit: 2019-03-17 | Discharge: 2019-03-17 | Disposition: A | Discharge status: Home / Self Care | Payer: BC Managed Care – PPO | Source: Ambulatory Visit | Attending: Nurse Practitioner | Admitting: Nurse Practitioner

## 2019-03-17 ENCOUNTER — Other Ambulatory Visit: Payer: Self-pay

## 2019-03-17 DIAGNOSIS — M8589 Other specified disorders of bone density and structure, multiple sites: Secondary | ICD-10-CM | POA: Diagnosis not present

## 2019-03-17 DIAGNOSIS — Z1231 Encounter for screening mammogram for malignant neoplasm of breast: Secondary | ICD-10-CM

## 2019-03-17 DIAGNOSIS — M810 Age-related osteoporosis without current pathological fracture: Secondary | ICD-10-CM

## 2019-03-17 DIAGNOSIS — Z780 Asymptomatic menopausal state: Secondary | ICD-10-CM | POA: Diagnosis not present

## 2019-03-24 DIAGNOSIS — M810 Age-related osteoporosis without current pathological fracture: Secondary | ICD-10-CM | POA: Diagnosis not present

## 2019-04-15 DIAGNOSIS — M5412 Radiculopathy, cervical region: Secondary | ICD-10-CM | POA: Diagnosis not present

## 2019-05-31 DIAGNOSIS — M5412 Radiculopathy, cervical region: Secondary | ICD-10-CM | POA: Diagnosis not present

## 2019-06-03 DIAGNOSIS — M5412 Radiculopathy, cervical region: Secondary | ICD-10-CM | POA: Diagnosis not present

## 2019-06-08 DIAGNOSIS — M5412 Radiculopathy, cervical region: Secondary | ICD-10-CM | POA: Diagnosis not present

## 2019-06-14 DIAGNOSIS — M5412 Radiculopathy, cervical region: Secondary | ICD-10-CM | POA: Diagnosis not present

## 2019-06-17 DIAGNOSIS — M5412 Radiculopathy, cervical region: Secondary | ICD-10-CM | POA: Diagnosis not present

## 2019-06-21 DIAGNOSIS — M5412 Radiculopathy, cervical region: Secondary | ICD-10-CM | POA: Diagnosis not present

## 2019-06-28 DIAGNOSIS — M5412 Radiculopathy, cervical region: Secondary | ICD-10-CM | POA: Diagnosis not present

## 2019-06-30 DIAGNOSIS — I10 Essential (primary) hypertension: Secondary | ICD-10-CM | POA: Diagnosis not present

## 2019-06-30 DIAGNOSIS — M5030 Other cervical disc degeneration, unspecified cervical region: Secondary | ICD-10-CM | POA: Diagnosis not present

## 2019-06-30 DIAGNOSIS — M47812 Spondylosis without myelopathy or radiculopathy, cervical region: Secondary | ICD-10-CM | POA: Diagnosis not present

## 2019-06-30 DIAGNOSIS — M4802 Spinal stenosis, cervical region: Secondary | ICD-10-CM | POA: Diagnosis not present

## 2019-06-30 DIAGNOSIS — M4722 Other spondylosis with radiculopathy, cervical region: Secondary | ICD-10-CM | POA: Diagnosis not present

## 2019-07-05 DIAGNOSIS — M5412 Radiculopathy, cervical region: Secondary | ICD-10-CM | POA: Diagnosis not present

## 2019-07-19 DIAGNOSIS — M5412 Radiculopathy, cervical region: Secondary | ICD-10-CM | POA: Diagnosis not present

## 2019-07-27 DIAGNOSIS — M4722 Other spondylosis with radiculopathy, cervical region: Secondary | ICD-10-CM | POA: Diagnosis not present

## 2019-07-27 DIAGNOSIS — M542 Cervicalgia: Secondary | ICD-10-CM | POA: Diagnosis not present

## 2019-07-27 DIAGNOSIS — M6281 Muscle weakness (generalized): Secondary | ICD-10-CM | POA: Diagnosis not present

## 2019-07-27 DIAGNOSIS — M25511 Pain in right shoulder: Secondary | ICD-10-CM | POA: Diagnosis not present

## 2019-07-30 DIAGNOSIS — M4722 Other spondylosis with radiculopathy, cervical region: Secondary | ICD-10-CM | POA: Diagnosis not present

## 2019-07-30 DIAGNOSIS — M25511 Pain in right shoulder: Secondary | ICD-10-CM | POA: Diagnosis not present

## 2019-07-30 DIAGNOSIS — M542 Cervicalgia: Secondary | ICD-10-CM | POA: Diagnosis not present

## 2019-07-30 DIAGNOSIS — M6281 Muscle weakness (generalized): Secondary | ICD-10-CM | POA: Diagnosis not present

## 2019-09-14 NOTE — Progress Notes (Deleted)
Office Visit Note  Patient: [name]             Date of Birth: 10/20/1960           MRN: [id number]             PCP: Elizabeth Palau, FNP Referring: Elizabeth Palau, FNP Visit Date: 09/28/2019 Occupation: @GUAROCC @  Subjective:  No chief complaint on file.   History of Present Illness: ALANEE TING is a 59 y.o. female ***   Activities of Daily Living:  Patient reports morning stiffness for *** {minute/hour:19697}.   Patient {ACTIONS;DENIES/REPORTS:21021675::"Denies"} nocturnal pain.  Difficulty dressing/grooming: {ACTIONS;DENIES/REPORTS:21021675::"Denies"} Difficulty climbing stairs: {ACTIONS;DENIES/REPORTS:21021675::"Denies"} Difficulty getting out of chair: {ACTIONS;DENIES/REPORTS:21021675::"Denies"} Difficulty using hands for taps, buttons, cutlery, and/or writing: {ACTIONS;DENIES/REPORTS:21021675::"Denies"}  No Rheumatology ROS completed.   PMFS History:  Patient Active Problem List   Diagnosis Date Noted  . Osteoporosis without current pathological fracture 03/04/2017  . Dysphagia, pharyngoesophageal phase 08/03/2014  . Colon cancer screening 08/03/2014  . Hiatal hernia 04/05/2014  . Influenza vaccination declined 04/05/2014  . IBS (irritable bowel syndrome) 06/02/2013  . Medication management 06/02/2013  . H/O vitamin D deficiency 06/02/2013  . Depression 06/02/2013  . Degenerative joint disease of cervical spine 06/23/2012  . Degenerative joint disease of low back 06/23/2012  . GERD (gastroesophageal reflux disease) 06/17/2012  . Asthma, chronic 06/17/2012  . Fibromyalgia 03/15/2011  . Hypertension 03/15/2011    Past Medical History:  Diagnosis Date  . Anxiety   . Asthma   . Complication of anesthesia or sedation in labor and delivery 1991   1 time  . DDD (degenerative disc disease), cervical    and lower back  . Depression   . Fibromyalgia   . GERD (gastroesophageal reflux disease)   . Headache(784.0)    frequent  . Heart murmur   .  Hiatal hernia   . Hypertension   . IBS (irritable bowel syndrome)   . Migraines   . Osteoporosis   . 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 Seasonal allergies    environmental allergies and a lot food allergies    Family History  Adopted: Yes  Problem Relation Age of Onset  . Diabetes Mother   . Arthritis Father   . Osteoporosis Sister   . Osteoarthritis Sister   . Healthy Daughter   . Healthy Daughter   . Healthy Daughter   . Colon cancer Neg Hx   . Esophageal cancer Neg Hx   . Rectal cancer Neg Hx   . Stomach cancer Neg Hx   . Breast cancer Neg Hx    Past Surgical History:  Procedure Laterality Date  . BREAST BIOPSY Left 2018  . CESAREAN SECTION     x 3   . CHOLECYSTECTOMY    . COLONOSCOPY    . 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   Both adopted parents died in the same year recently esophageal cancer and bladder cancer.   etoh 1-3 hs    Now  Lives alone   Not working. Fibromyalgia  Limites her  Somewhat?                      There is no immunization history on file for this patient.   Objective: Vital Signs: LMP 01/12/2011    Physical Exam   Musculoskeletal Exam: ***  CDAI Exam: CDAI Score: -- Patient Global: --; Provider Global: -- Swollen: --; Tender: -- Joint Exam 09/28/2019   No joint exam has been documented for this visit   There is currently no information documented on the homunculus. Go to the Rheumatology activity and complete  the homunculus joint exam.  Investigation: No additional findings.  Imaging: No results found.  Recent Labs: Lab Results  Component Value Date   WBC 5.4 11/18/2016   HGB 14.2 11/18/2016   PLT 289.0 11/18/2016   NA 139 03/04/2017   K 5.2 03/04/2017   CL 100 03/04/2017   CO2 31 03/04/2017   GLUCOSE 88 03/04/2017   BUN 16 03/04/2017   CREATININE 0.78 03/04/2017   BILITOT 0.4 11/18/2016   ALKPHOS 80 11/18/2016   AST 13 11/18/2016   ALT 10 11/18/2016   PROT 6.4 11/18/2016   ALBUMIN 4.0 11/18/2016   CALCIUM 9.6 03/04/2017   GFRAA 98 03/04/2017    Speciality Comments: Osteoporosis managed by another provider-ACY 09/28/2018  Procedures:  No procedures performed Allergies: Hydrocodone   Assessment / Plan:     Visit Diagnoses: No diagnosis found.  Orders: No orders of the defined types were placed in this encounter.  No orders of the defined types were placed in this encounter.   Face-to-face time spent with patient was *** minutes. Greater than 50% of time was spent in counseling and coordination of care.  Follow-Up Instructions: No follow-ups on file.   Ellen Henri, CMA  Note - This record has been created using Animal nutritionist.  Chart creation errors have been sought, but may not always  have been located. Such creation errors do not reflect on  the standard of medical care.

## 2019-09-28 ENCOUNTER — Ambulatory Visit: Payer: PRIVATE HEALTH INSURANCE | Admitting: Rheumatology

## 2019-09-28 DIAGNOSIS — M5030 Other cervical disc degeneration, unspecified cervical region: Secondary | ICD-10-CM

## 2019-09-28 DIAGNOSIS — K449 Diaphragmatic hernia without obstruction or gangrene: Secondary | ICD-10-CM

## 2019-09-28 DIAGNOSIS — Z8659 Personal history of other mental and behavioral disorders: Secondary | ICD-10-CM

## 2019-09-28 DIAGNOSIS — M810 Age-related osteoporosis without current pathological fracture: Secondary | ICD-10-CM

## 2019-09-28 DIAGNOSIS — M19041 Primary osteoarthritis, right hand: Secondary | ICD-10-CM

## 2019-09-28 DIAGNOSIS — Z8639 Personal history of other endocrine, nutritional and metabolic disease: Secondary | ICD-10-CM

## 2019-09-28 DIAGNOSIS — M797 Fibromyalgia: Secondary | ICD-10-CM

## 2019-09-28 DIAGNOSIS — I10 Essential (primary) hypertension: Secondary | ICD-10-CM

## 2019-09-28 DIAGNOSIS — Z8719 Personal history of other diseases of the digestive system: Secondary | ICD-10-CM

## 2019-09-28 DIAGNOSIS — R1314 Dysphagia, pharyngoesophageal phase: Secondary | ICD-10-CM

## 2019-09-28 DIAGNOSIS — M5136 Other intervertebral disc degeneration, lumbar region: Secondary | ICD-10-CM

## 2019-09-28 DIAGNOSIS — Z8709 Personal history of other diseases of the respiratory system: Secondary | ICD-10-CM

## 2019-12-09 ENCOUNTER — Other Ambulatory Visit: Payer: Self-pay | Admitting: Nurse Practitioner

## 2019-12-09 DIAGNOSIS — I10 Essential (primary) hypertension: Secondary | ICD-10-CM | POA: Diagnosis not present

## 2019-12-09 DIAGNOSIS — N63 Unspecified lump in unspecified breast: Secondary | ICD-10-CM | POA: Diagnosis not present

## 2019-12-09 DIAGNOSIS — Z79899 Other long term (current) drug therapy: Secondary | ICD-10-CM | POA: Diagnosis not present

## 2019-12-09 DIAGNOSIS — Z0000 Encounter for general adult medical examination without abnormal findings: Secondary | ICD-10-CM | POA: Diagnosis not present

## 2019-12-09 DIAGNOSIS — F329 Major depressive disorder, single episode, unspecified: Secondary | ICD-10-CM | POA: Diagnosis not present

## 2019-12-09 DIAGNOSIS — M810 Age-related osteoporosis without current pathological fracture: Secondary | ICD-10-CM | POA: Diagnosis not present

## 2019-12-09 DIAGNOSIS — N6320 Unspecified lump in the left breast, unspecified quadrant: Secondary | ICD-10-CM

## 2019-12-20 ENCOUNTER — Other Ambulatory Visit: Payer: Self-pay

## 2019-12-20 ENCOUNTER — Ambulatory Visit
Admission: RE | Admit: 2019-12-20 | Discharge: 2019-12-20 | Disposition: A | Discharge status: Home / Self Care | Payer: PRIVATE HEALTH INSURANCE | Source: Ambulatory Visit | Attending: Nurse Practitioner | Admitting: Nurse Practitioner

## 2019-12-20 DIAGNOSIS — N6320 Unspecified lump in the left breast, unspecified quadrant: Secondary | ICD-10-CM

## 2019-12-20 DIAGNOSIS — N6489 Other specified disorders of breast: Secondary | ICD-10-CM | POA: Diagnosis not present

## 2019-12-20 DIAGNOSIS — R922 Inconclusive mammogram: Secondary | ICD-10-CM | POA: Diagnosis not present

## 2020-01-05 ENCOUNTER — Other Ambulatory Visit: Payer: PRIVATE HEALTH INSURANCE

## 2020-02-09 DIAGNOSIS — J069 Acute upper respiratory infection, unspecified: Secondary | ICD-10-CM | POA: Diagnosis not present

## 2020-03-08 ENCOUNTER — Other Ambulatory Visit: Payer: Self-pay | Admitting: Nurse Practitioner

## 2020-03-08 DIAGNOSIS — Z1231 Encounter for screening mammogram for malignant neoplasm of breast: Secondary | ICD-10-CM

## 2020-04-03 ENCOUNTER — Other Ambulatory Visit: Payer: Self-pay

## 2020-04-03 ENCOUNTER — Ambulatory Visit
Admission: RE | Admit: 2020-04-03 | Discharge: 2020-04-03 | Disposition: A | Discharge status: Home / Self Care | Payer: Self-pay | Source: Ambulatory Visit | Attending: Nurse Practitioner | Admitting: Nurse Practitioner

## 2020-04-03 DIAGNOSIS — Z1231 Encounter for screening mammogram for malignant neoplasm of breast: Secondary | ICD-10-CM

## 2020-11-01 DIAGNOSIS — Z79899 Other long term (current) drug therapy: Secondary | ICD-10-CM | POA: Diagnosis not present

## 2020-11-01 DIAGNOSIS — J4520 Mild intermittent asthma, uncomplicated: Secondary | ICD-10-CM | POA: Diagnosis not present

## 2020-11-01 DIAGNOSIS — I10 Essential (primary) hypertension: Secondary | ICD-10-CM | POA: Diagnosis not present

## 2020-11-01 DIAGNOSIS — R002 Palpitations: Secondary | ICD-10-CM | POA: Diagnosis not present

## 2020-12-07 DIAGNOSIS — F3342 Major depressive disorder, recurrent, in full remission: Secondary | ICD-10-CM | POA: Diagnosis not present

## 2020-12-12 DIAGNOSIS — B9689 Other specified bacterial agents as the cause of diseases classified elsewhere: Secondary | ICD-10-CM | POA: Diagnosis not present

## 2020-12-12 DIAGNOSIS — R062 Wheezing: Secondary | ICD-10-CM | POA: Diagnosis not present

## 2020-12-12 DIAGNOSIS — J208 Acute bronchitis due to other specified organisms: Secondary | ICD-10-CM | POA: Diagnosis not present

## 2020-12-26 DIAGNOSIS — R002 Palpitations: Secondary | ICD-10-CM | POA: Diagnosis not present

## 2020-12-26 DIAGNOSIS — I10 Essential (primary) hypertension: Secondary | ICD-10-CM | POA: Diagnosis not present

## 2021-01-16 DIAGNOSIS — I10 Essential (primary) hypertension: Secondary | ICD-10-CM | POA: Diagnosis not present

## 2021-01-16 DIAGNOSIS — R002 Palpitations: Secondary | ICD-10-CM | POA: Diagnosis not present

## 2021-02-07 DIAGNOSIS — I10 Essential (primary) hypertension: Secondary | ICD-10-CM | POA: Diagnosis not present

## 2021-02-07 DIAGNOSIS — R002 Palpitations: Secondary | ICD-10-CM | POA: Diagnosis not present

## 2021-03-02 ENCOUNTER — Other Ambulatory Visit: Payer: Self-pay | Admitting: Nurse Practitioner

## 2021-03-02 DIAGNOSIS — Z1231 Encounter for screening mammogram for malignant neoplasm of breast: Secondary | ICD-10-CM

## 2021-03-28 DIAGNOSIS — I10 Essential (primary) hypertension: Secondary | ICD-10-CM | POA: Diagnosis not present

## 2021-03-28 DIAGNOSIS — R5383 Other fatigue: Secondary | ICD-10-CM | POA: Diagnosis not present

## 2021-03-28 DIAGNOSIS — R002 Palpitations: Secondary | ICD-10-CM | POA: Diagnosis not present

## 2021-03-29 DIAGNOSIS — B9689 Other specified bacterial agents as the cause of diseases classified elsewhere: Secondary | ICD-10-CM | POA: Diagnosis not present

## 2021-03-29 DIAGNOSIS — R059 Cough, unspecified: Secondary | ICD-10-CM | POA: Diagnosis not present

## 2021-03-29 DIAGNOSIS — R0981 Nasal congestion: Secondary | ICD-10-CM | POA: Diagnosis not present

## 2021-03-29 DIAGNOSIS — J208 Acute bronchitis due to other specified organisms: Secondary | ICD-10-CM | POA: Diagnosis not present

## 2021-03-29 DIAGNOSIS — R093 Abnormal sputum: Secondary | ICD-10-CM | POA: Diagnosis not present

## 2021-03-29 DIAGNOSIS — I10 Essential (primary) hypertension: Secondary | ICD-10-CM | POA: Diagnosis not present

## 2021-03-29 DIAGNOSIS — J45901 Unspecified asthma with (acute) exacerbation: Secondary | ICD-10-CM | POA: Diagnosis not present

## 2021-04-04 ENCOUNTER — Ambulatory Visit
Admission: RE | Admit: 2021-04-04 | Discharge: 2021-04-04 | Disposition: A | Discharge status: Home / Self Care | Payer: BC Managed Care – PPO | Source: Ambulatory Visit | Attending: Nurse Practitioner | Admitting: Nurse Practitioner

## 2021-04-04 DIAGNOSIS — Z1231 Encounter for screening mammogram for malignant neoplasm of breast: Secondary | ICD-10-CM

## 2021-04-17 DIAGNOSIS — R400 Somnolence: Secondary | ICD-10-CM | POA: Diagnosis not present

## 2021-04-17 DIAGNOSIS — R0683 Snoring: Secondary | ICD-10-CM | POA: Diagnosis not present

## 2021-04-17 DIAGNOSIS — J4520 Mild intermittent asthma, uncomplicated: Secondary | ICD-10-CM | POA: Diagnosis not present

## 2021-04-17 DIAGNOSIS — I10 Essential (primary) hypertension: Secondary | ICD-10-CM | POA: Diagnosis not present

## 2021-05-10 DIAGNOSIS — M810 Age-related osteoporosis without current pathological fracture: Secondary | ICD-10-CM | POA: Diagnosis not present

## 2021-05-10 DIAGNOSIS — R002 Palpitations: Secondary | ICD-10-CM | POA: Diagnosis not present

## 2021-05-10 DIAGNOSIS — I10 Essential (primary) hypertension: Secondary | ICD-10-CM | POA: Diagnosis not present

## 2021-05-10 DIAGNOSIS — Z23 Encounter for immunization: Secondary | ICD-10-CM | POA: Diagnosis not present

## 2021-05-10 DIAGNOSIS — Z79899 Other long term (current) drug therapy: Secondary | ICD-10-CM | POA: Diagnosis not present

## 2021-05-16 ENCOUNTER — Other Ambulatory Visit: Payer: Self-pay | Admitting: Nurse Practitioner

## 2021-05-16 DIAGNOSIS — M810 Age-related osteoporosis without current pathological fracture: Secondary | ICD-10-CM

## 2021-05-17 DIAGNOSIS — G4733 Obstructive sleep apnea (adult) (pediatric): Secondary | ICD-10-CM | POA: Diagnosis not present

## 2021-05-17 DIAGNOSIS — R400 Somnolence: Secondary | ICD-10-CM | POA: Diagnosis not present

## 2021-05-17 DIAGNOSIS — I10 Essential (primary) hypertension: Secondary | ICD-10-CM | POA: Diagnosis not present

## 2021-05-17 DIAGNOSIS — J4520 Mild intermittent asthma, uncomplicated: Secondary | ICD-10-CM | POA: Diagnosis not present

## 2021-05-17 DIAGNOSIS — R002 Palpitations: Secondary | ICD-10-CM | POA: Diagnosis not present

## 2021-05-17 DIAGNOSIS — R0683 Snoring: Secondary | ICD-10-CM | POA: Diagnosis not present

## 2021-05-29 DIAGNOSIS — R002 Palpitations: Secondary | ICD-10-CM | POA: Diagnosis not present

## 2021-05-29 DIAGNOSIS — I10 Essential (primary) hypertension: Secondary | ICD-10-CM | POA: Diagnosis not present

## 2021-06-06 DIAGNOSIS — Z8781 Personal history of (healed) traumatic fracture: Secondary | ICD-10-CM | POA: Diagnosis not present

## 2021-06-06 DIAGNOSIS — Z87891 Personal history of nicotine dependence: Secondary | ICD-10-CM | POA: Diagnosis not present

## 2021-06-06 DIAGNOSIS — Z79899 Other long term (current) drug therapy: Secondary | ICD-10-CM | POA: Diagnosis not present

## 2021-06-06 DIAGNOSIS — M810 Age-related osteoporosis without current pathological fracture: Secondary | ICD-10-CM | POA: Diagnosis not present

## 2021-06-19 DIAGNOSIS — G4733 Obstructive sleep apnea (adult) (pediatric): Secondary | ICD-10-CM | POA: Diagnosis not present

## 2021-07-05 DIAGNOSIS — F1090 Alcohol use, unspecified, uncomplicated: Secondary | ICD-10-CM | POA: Diagnosis not present

## 2021-07-05 DIAGNOSIS — R200 Anesthesia of skin: Secondary | ICD-10-CM | POA: Diagnosis not present

## 2021-07-05 DIAGNOSIS — I10 Essential (primary) hypertension: Secondary | ICD-10-CM | POA: Diagnosis not present

## 2021-07-05 DIAGNOSIS — M4722 Other spondylosis with radiculopathy, cervical region: Secondary | ICD-10-CM | POA: Diagnosis not present

## 2021-07-17 DIAGNOSIS — M47812 Spondylosis without myelopathy or radiculopathy, cervical region: Secondary | ICD-10-CM | POA: Diagnosis not present

## 2021-07-17 DIAGNOSIS — R202 Paresthesia of skin: Secondary | ICD-10-CM | POA: Diagnosis not present

## 2021-07-17 DIAGNOSIS — I10 Essential (primary) hypertension: Secondary | ICD-10-CM | POA: Diagnosis not present

## 2021-07-19 DIAGNOSIS — M50321 Other cervical disc degeneration at C4-C5 level: Secondary | ICD-10-CM | POA: Diagnosis not present

## 2021-07-19 DIAGNOSIS — Z8481 Family history of carrier of genetic disease: Secondary | ICD-10-CM | POA: Diagnosis not present

## 2021-07-19 DIAGNOSIS — Z8489 Family history of other specified conditions: Secondary | ICD-10-CM | POA: Diagnosis not present

## 2021-07-19 DIAGNOSIS — Z7183 Encounter for nonprocreative genetic counseling: Secondary | ICD-10-CM | POA: Diagnosis not present

## 2021-07-19 DIAGNOSIS — M4312 Spondylolisthesis, cervical region: Secondary | ICD-10-CM | POA: Diagnosis not present

## 2021-07-19 DIAGNOSIS — M47812 Spondylosis without myelopathy or radiculopathy, cervical region: Secondary | ICD-10-CM | POA: Diagnosis not present

## 2021-07-19 DIAGNOSIS — Z1371 Encounter for nonprocreative screening for genetic disease carrier status: Secondary | ICD-10-CM | POA: Diagnosis not present

## 2021-07-20 DIAGNOSIS — G4733 Obstructive sleep apnea (adult) (pediatric): Secondary | ICD-10-CM | POA: Diagnosis not present

## 2021-07-26 DIAGNOSIS — I10 Essential (primary) hypertension: Secondary | ICD-10-CM | POA: Diagnosis not present

## 2021-07-26 DIAGNOSIS — R002 Palpitations: Secondary | ICD-10-CM | POA: Diagnosis not present

## 2021-08-19 DIAGNOSIS — G4733 Obstructive sleep apnea (adult) (pediatric): Secondary | ICD-10-CM | POA: Diagnosis not present

## 2021-08-22 DIAGNOSIS — R200 Anesthesia of skin: Secondary | ICD-10-CM | POA: Diagnosis not present

## 2021-08-22 DIAGNOSIS — M4722 Other spondylosis with radiculopathy, cervical region: Secondary | ICD-10-CM | POA: Diagnosis not present

## 2021-08-22 DIAGNOSIS — R52 Pain, unspecified: Secondary | ICD-10-CM | POA: Diagnosis not present

## 2021-08-22 DIAGNOSIS — I10 Essential (primary) hypertension: Secondary | ICD-10-CM | POA: Diagnosis not present

## 2021-08-22 DIAGNOSIS — W57XXXA Bitten or stung by nonvenomous insect and other nonvenomous arthropods, initial encounter: Secondary | ICD-10-CM | POA: Diagnosis not present

## 2021-08-22 DIAGNOSIS — E559 Vitamin D deficiency, unspecified: Secondary | ICD-10-CM | POA: Diagnosis not present

## 2021-08-22 DIAGNOSIS — Z79899 Other long term (current) drug therapy: Secondary | ICD-10-CM | POA: Diagnosis not present

## 2021-08-22 DIAGNOSIS — E538 Deficiency of other specified B group vitamins: Secondary | ICD-10-CM | POA: Diagnosis not present

## 2021-08-28 DIAGNOSIS — I10 Essential (primary) hypertension: Secondary | ICD-10-CM | POA: Diagnosis not present

## 2021-08-28 DIAGNOSIS — M47812 Spondylosis without myelopathy or radiculopathy, cervical region: Secondary | ICD-10-CM | POA: Diagnosis not present

## 2021-08-28 DIAGNOSIS — R202 Paresthesia of skin: Secondary | ICD-10-CM | POA: Diagnosis not present

## 2021-09-04 DIAGNOSIS — M5030 Other cervical disc degeneration, unspecified cervical region: Secondary | ICD-10-CM | POA: Diagnosis not present

## 2021-09-04 DIAGNOSIS — I10 Essential (primary) hypertension: Secondary | ICD-10-CM | POA: Diagnosis not present

## 2021-09-04 DIAGNOSIS — M4722 Other spondylosis with radiculopathy, cervical region: Secondary | ICD-10-CM | POA: Diagnosis not present

## 2021-09-04 DIAGNOSIS — M5136 Other intervertebral disc degeneration, lumbar region: Secondary | ICD-10-CM | POA: Diagnosis not present

## 2021-09-04 DIAGNOSIS — L089 Local infection of the skin and subcutaneous tissue, unspecified: Secondary | ICD-10-CM | POA: Diagnosis not present

## 2021-09-05 DIAGNOSIS — G4734 Idiopathic sleep related nonobstructive alveolar hypoventilation: Secondary | ICD-10-CM | POA: Diagnosis not present

## 2021-09-05 DIAGNOSIS — G4733 Obstructive sleep apnea (adult) (pediatric): Secondary | ICD-10-CM | POA: Diagnosis not present

## 2021-09-13 DIAGNOSIS — F331 Major depressive disorder, recurrent, moderate: Secondary | ICD-10-CM | POA: Diagnosis not present

## 2021-09-19 DIAGNOSIS — G4733 Obstructive sleep apnea (adult) (pediatric): Secondary | ICD-10-CM | POA: Diagnosis not present

## 2021-10-02 DIAGNOSIS — F330 Major depressive disorder, recurrent, mild: Secondary | ICD-10-CM | POA: Diagnosis not present

## 2021-10-08 DIAGNOSIS — F330 Major depressive disorder, recurrent, mild: Secondary | ICD-10-CM | POA: Diagnosis not present

## 2021-10-10 DIAGNOSIS — I10 Essential (primary) hypertension: Secondary | ICD-10-CM | POA: Diagnosis not present

## 2021-10-10 DIAGNOSIS — R238 Other skin changes: Secondary | ICD-10-CM | POA: Diagnosis not present

## 2021-10-17 DIAGNOSIS — I10 Essential (primary) hypertension: Secondary | ICD-10-CM | POA: Diagnosis not present

## 2021-10-17 DIAGNOSIS — F330 Major depressive disorder, recurrent, mild: Secondary | ICD-10-CM | POA: Diagnosis not present

## 2021-10-17 DIAGNOSIS — Z5189 Encounter for other specified aftercare: Secondary | ICD-10-CM | POA: Diagnosis not present

## 2021-10-20 DIAGNOSIS — G4733 Obstructive sleep apnea (adult) (pediatric): Secondary | ICD-10-CM | POA: Diagnosis not present

## 2021-10-22 DIAGNOSIS — F330 Major depressive disorder, recurrent, mild: Secondary | ICD-10-CM | POA: Diagnosis not present

## 2021-10-26 DIAGNOSIS — F330 Major depressive disorder, recurrent, mild: Secondary | ICD-10-CM | POA: Diagnosis not present

## 2021-10-26 DIAGNOSIS — F419 Anxiety disorder, unspecified: Secondary | ICD-10-CM | POA: Diagnosis not present

## 2021-10-26 DIAGNOSIS — F1020 Alcohol dependence, uncomplicated: Secondary | ICD-10-CM | POA: Diagnosis not present

## 2021-10-30 DIAGNOSIS — M797 Fibromyalgia: Secondary | ICD-10-CM | POA: Diagnosis not present

## 2021-10-30 DIAGNOSIS — I10 Essential (primary) hypertension: Secondary | ICD-10-CM | POA: Diagnosis not present

## 2021-10-30 DIAGNOSIS — M5459 Other low back pain: Secondary | ICD-10-CM | POA: Diagnosis not present

## 2021-10-30 DIAGNOSIS — M47816 Spondylosis without myelopathy or radiculopathy, lumbar region: Secondary | ICD-10-CM | POA: Diagnosis not present

## 2021-10-30 DIAGNOSIS — G5603 Carpal tunnel syndrome, bilateral upper limbs: Secondary | ICD-10-CM | POA: Diagnosis not present

## 2021-10-30 DIAGNOSIS — F330 Major depressive disorder, recurrent, mild: Secondary | ICD-10-CM | POA: Diagnosis not present

## 2021-11-05 ENCOUNTER — Ambulatory Visit
Admission: RE | Admit: 2021-11-05 | Discharge: 2021-11-05 | Disposition: A | Discharge status: Home / Self Care | Payer: BC Managed Care – PPO | Source: Ambulatory Visit | Attending: Nurse Practitioner | Admitting: Nurse Practitioner

## 2021-11-05 DIAGNOSIS — Z780 Asymptomatic menopausal state: Secondary | ICD-10-CM | POA: Diagnosis not present

## 2021-11-05 DIAGNOSIS — M8589 Other specified disorders of bone density and structure, multiple sites: Secondary | ICD-10-CM | POA: Diagnosis not present

## 2021-11-05 DIAGNOSIS — M810 Age-related osteoporosis without current pathological fracture: Secondary | ICD-10-CM

## 2021-11-06 DIAGNOSIS — F330 Major depressive disorder, recurrent, mild: Secondary | ICD-10-CM | POA: Diagnosis not present

## 2021-11-12 DIAGNOSIS — F330 Major depressive disorder, recurrent, mild: Secondary | ICD-10-CM | POA: Diagnosis not present

## 2021-11-13 DIAGNOSIS — G4733 Obstructive sleep apnea (adult) (pediatric): Secondary | ICD-10-CM | POA: Diagnosis not present

## 2021-11-19 DIAGNOSIS — G4733 Obstructive sleep apnea (adult) (pediatric): Secondary | ICD-10-CM | POA: Diagnosis not present

## 2021-11-20 DIAGNOSIS — F329 Major depressive disorder, single episode, unspecified: Secondary | ICD-10-CM | POA: Diagnosis not present

## 2021-11-20 DIAGNOSIS — Z124 Encounter for screening for malignant neoplasm of cervix: Secondary | ICD-10-CM | POA: Diagnosis not present

## 2021-11-20 DIAGNOSIS — K589 Irritable bowel syndrome without diarrhea: Secondary | ICD-10-CM | POA: Diagnosis not present

## 2021-11-20 DIAGNOSIS — I10 Essential (primary) hypertension: Secondary | ICD-10-CM | POA: Diagnosis not present

## 2021-11-20 DIAGNOSIS — F330 Major depressive disorder, recurrent, mild: Secondary | ICD-10-CM | POA: Diagnosis not present

## 2021-11-20 DIAGNOSIS — Z0000 Encounter for general adult medical examination without abnormal findings: Secondary | ICD-10-CM | POA: Diagnosis not present

## 2021-11-20 DIAGNOSIS — M810 Age-related osteoporosis without current pathological fracture: Secondary | ICD-10-CM | POA: Diagnosis not present

## 2021-11-20 DIAGNOSIS — R002 Palpitations: Secondary | ICD-10-CM | POA: Diagnosis not present

## 2021-11-20 DIAGNOSIS — Z8639 Personal history of other endocrine, nutritional and metabolic disease: Secondary | ICD-10-CM | POA: Diagnosis not present

## 2021-11-26 DIAGNOSIS — L2489 Irritant contact dermatitis due to other agents: Secondary | ICD-10-CM | POA: Diagnosis not present

## 2021-11-26 DIAGNOSIS — L905 Scar conditions and fibrosis of skin: Secondary | ICD-10-CM | POA: Diagnosis not present

## 2021-11-27 DIAGNOSIS — M810 Age-related osteoporosis without current pathological fracture: Secondary | ICD-10-CM | POA: Diagnosis not present

## 2021-12-04 DIAGNOSIS — R2689 Other abnormalities of gait and mobility: Secondary | ICD-10-CM | POA: Diagnosis not present

## 2021-12-04 DIAGNOSIS — M47812 Spondylosis without myelopathy or radiculopathy, cervical region: Secondary | ICD-10-CM | POA: Diagnosis not present

## 2021-12-04 DIAGNOSIS — F330 Major depressive disorder, recurrent, mild: Secondary | ICD-10-CM | POA: Diagnosis not present

## 2021-12-04 DIAGNOSIS — R262 Difficulty in walking, not elsewhere classified: Secondary | ICD-10-CM | POA: Diagnosis not present

## 2021-12-04 DIAGNOSIS — M47816 Spondylosis without myelopathy or radiculopathy, lumbar region: Secondary | ICD-10-CM | POA: Diagnosis not present

## 2021-12-11 DIAGNOSIS — F330 Major depressive disorder, recurrent, mild: Secondary | ICD-10-CM | POA: Diagnosis not present

## 2021-12-12 DIAGNOSIS — G4733 Obstructive sleep apnea (adult) (pediatric): Secondary | ICD-10-CM | POA: Diagnosis not present

## 2021-12-12 DIAGNOSIS — M47816 Spondylosis without myelopathy or radiculopathy, lumbar region: Secondary | ICD-10-CM | POA: Diagnosis not present

## 2021-12-12 DIAGNOSIS — M47812 Spondylosis without myelopathy or radiculopathy, cervical region: Secondary | ICD-10-CM | POA: Diagnosis not present

## 2021-12-12 DIAGNOSIS — R262 Difficulty in walking, not elsewhere classified: Secondary | ICD-10-CM | POA: Diagnosis not present

## 2021-12-12 DIAGNOSIS — R2689 Other abnormalities of gait and mobility: Secondary | ICD-10-CM | POA: Diagnosis not present

## 2021-12-18 DIAGNOSIS — F330 Major depressive disorder, recurrent, mild: Secondary | ICD-10-CM | POA: Diagnosis not present

## 2021-12-19 DIAGNOSIS — M47816 Spondylosis without myelopathy or radiculopathy, lumbar region: Secondary | ICD-10-CM | POA: Diagnosis not present

## 2021-12-19 DIAGNOSIS — H04123 Dry eye syndrome of bilateral lacrimal glands: Secondary | ICD-10-CM | POA: Diagnosis not present

## 2021-12-19 DIAGNOSIS — R2689 Other abnormalities of gait and mobility: Secondary | ICD-10-CM | POA: Diagnosis not present

## 2021-12-19 DIAGNOSIS — M47812 Spondylosis without myelopathy or radiculopathy, cervical region: Secondary | ICD-10-CM | POA: Diagnosis not present

## 2021-12-19 DIAGNOSIS — R262 Difficulty in walking, not elsewhere classified: Secondary | ICD-10-CM | POA: Diagnosis not present

## 2021-12-20 DIAGNOSIS — G4733 Obstructive sleep apnea (adult) (pediatric): Secondary | ICD-10-CM | POA: Diagnosis not present

## 2021-12-26 DIAGNOSIS — F330 Major depressive disorder, recurrent, mild: Secondary | ICD-10-CM | POA: Diagnosis not present

## 2021-12-26 DIAGNOSIS — R262 Difficulty in walking, not elsewhere classified: Secondary | ICD-10-CM | POA: Diagnosis not present

## 2021-12-26 DIAGNOSIS — M47812 Spondylosis without myelopathy or radiculopathy, cervical region: Secondary | ICD-10-CM | POA: Diagnosis not present

## 2021-12-26 DIAGNOSIS — M47816 Spondylosis without myelopathy or radiculopathy, lumbar region: Secondary | ICD-10-CM | POA: Diagnosis not present

## 2021-12-26 DIAGNOSIS — R2689 Other abnormalities of gait and mobility: Secondary | ICD-10-CM | POA: Diagnosis not present

## 2021-12-27 DIAGNOSIS — L570 Actinic keratosis: Secondary | ICD-10-CM | POA: Diagnosis not present

## 2021-12-27 DIAGNOSIS — D485 Neoplasm of uncertain behavior of skin: Secondary | ICD-10-CM | POA: Diagnosis not present

## 2021-12-27 DIAGNOSIS — L719 Rosacea, unspecified: Secondary | ICD-10-CM | POA: Diagnosis not present

## 2022-01-01 DIAGNOSIS — F330 Major depressive disorder, recurrent, mild: Secondary | ICD-10-CM | POA: Diagnosis not present

## 2022-01-02 DIAGNOSIS — R2689 Other abnormalities of gait and mobility: Secondary | ICD-10-CM | POA: Diagnosis not present

## 2022-01-02 DIAGNOSIS — M47816 Spondylosis without myelopathy or radiculopathy, lumbar region: Secondary | ICD-10-CM | POA: Diagnosis not present

## 2022-01-02 DIAGNOSIS — M47812 Spondylosis without myelopathy or radiculopathy, cervical region: Secondary | ICD-10-CM | POA: Diagnosis not present

## 2022-01-02 DIAGNOSIS — R262 Difficulty in walking, not elsewhere classified: Secondary | ICD-10-CM | POA: Diagnosis not present

## 2022-01-07 DIAGNOSIS — F330 Major depressive disorder, recurrent, mild: Secondary | ICD-10-CM | POA: Diagnosis not present

## 2022-01-09 DIAGNOSIS — R2689 Other abnormalities of gait and mobility: Secondary | ICD-10-CM | POA: Diagnosis not present

## 2022-01-09 DIAGNOSIS — M47816 Spondylosis without myelopathy or radiculopathy, lumbar region: Secondary | ICD-10-CM | POA: Diagnosis not present

## 2022-01-09 DIAGNOSIS — M47812 Spondylosis without myelopathy or radiculopathy, cervical region: Secondary | ICD-10-CM | POA: Diagnosis not present

## 2022-01-09 DIAGNOSIS — R262 Difficulty in walking, not elsewhere classified: Secondary | ICD-10-CM | POA: Diagnosis not present

## 2022-01-10 DIAGNOSIS — L821 Other seborrheic keratosis: Secondary | ICD-10-CM | POA: Diagnosis not present

## 2022-01-15 DIAGNOSIS — F330 Major depressive disorder, recurrent, mild: Secondary | ICD-10-CM | POA: Diagnosis not present

## 2022-01-19 DIAGNOSIS — G4733 Obstructive sleep apnea (adult) (pediatric): Secondary | ICD-10-CM | POA: Diagnosis not present

## 2022-01-23 DIAGNOSIS — M47816 Spondylosis without myelopathy or radiculopathy, lumbar region: Secondary | ICD-10-CM | POA: Diagnosis not present

## 2022-01-23 DIAGNOSIS — R262 Difficulty in walking, not elsewhere classified: Secondary | ICD-10-CM | POA: Diagnosis not present

## 2022-01-23 DIAGNOSIS — R2689 Other abnormalities of gait and mobility: Secondary | ICD-10-CM | POA: Diagnosis not present

## 2022-01-23 DIAGNOSIS — M47812 Spondylosis without myelopathy or radiculopathy, cervical region: Secondary | ICD-10-CM | POA: Diagnosis not present

## 2022-01-24 DIAGNOSIS — F330 Major depressive disorder, recurrent, mild: Secondary | ICD-10-CM | POA: Diagnosis not present

## 2022-01-28 DIAGNOSIS — F330 Major depressive disorder, recurrent, mild: Secondary | ICD-10-CM | POA: Diagnosis not present

## 2022-01-29 DIAGNOSIS — R2689 Other abnormalities of gait and mobility: Secondary | ICD-10-CM | POA: Diagnosis not present

## 2022-01-29 DIAGNOSIS — M47812 Spondylosis without myelopathy or radiculopathy, cervical region: Secondary | ICD-10-CM | POA: Diagnosis not present

## 2022-01-29 DIAGNOSIS — M47816 Spondylosis without myelopathy or radiculopathy, lumbar region: Secondary | ICD-10-CM | POA: Diagnosis not present

## 2022-01-29 DIAGNOSIS — R262 Difficulty in walking, not elsewhere classified: Secondary | ICD-10-CM | POA: Diagnosis not present

## 2022-02-06 DIAGNOSIS — R262 Difficulty in walking, not elsewhere classified: Secondary | ICD-10-CM | POA: Diagnosis not present

## 2022-02-06 DIAGNOSIS — R2689 Other abnormalities of gait and mobility: Secondary | ICD-10-CM | POA: Diagnosis not present

## 2022-02-06 DIAGNOSIS — M47812 Spondylosis without myelopathy or radiculopathy, cervical region: Secondary | ICD-10-CM | POA: Diagnosis not present

## 2022-02-06 DIAGNOSIS — M47816 Spondylosis without myelopathy or radiculopathy, lumbar region: Secondary | ICD-10-CM | POA: Diagnosis not present

## 2022-02-13 DIAGNOSIS — G4733 Obstructive sleep apnea (adult) (pediatric): Secondary | ICD-10-CM | POA: Diagnosis not present

## 2022-02-14 DIAGNOSIS — G5603 Carpal tunnel syndrome, bilateral upper limbs: Secondary | ICD-10-CM | POA: Diagnosis not present

## 2022-02-14 DIAGNOSIS — M5459 Other low back pain: Secondary | ICD-10-CM | POA: Diagnosis not present

## 2022-02-14 DIAGNOSIS — M47812 Spondylosis without myelopathy or radiculopathy, cervical region: Secondary | ICD-10-CM | POA: Diagnosis not present

## 2022-02-14 DIAGNOSIS — M47816 Spondylosis without myelopathy or radiculopathy, lumbar region: Secondary | ICD-10-CM | POA: Diagnosis not present

## 2022-02-14 DIAGNOSIS — G4733 Obstructive sleep apnea (adult) (pediatric): Secondary | ICD-10-CM | POA: Diagnosis not present

## 2022-02-14 DIAGNOSIS — F330 Major depressive disorder, recurrent, mild: Secondary | ICD-10-CM | POA: Diagnosis not present

## 2022-02-19 DIAGNOSIS — F330 Major depressive disorder, recurrent, mild: Secondary | ICD-10-CM | POA: Diagnosis not present

## 2022-02-19 DIAGNOSIS — G4733 Obstructive sleep apnea (adult) (pediatric): Secondary | ICD-10-CM | POA: Diagnosis not present

## 2022-02-20 DIAGNOSIS — M5459 Other low back pain: Secondary | ICD-10-CM | POA: Diagnosis not present

## 2022-02-20 DIAGNOSIS — F329 Major depressive disorder, single episode, unspecified: Secondary | ICD-10-CM | POA: Diagnosis not present

## 2022-02-20 DIAGNOSIS — G5603 Carpal tunnel syndrome, bilateral upper limbs: Secondary | ICD-10-CM | POA: Diagnosis not present

## 2022-02-20 DIAGNOSIS — I10 Essential (primary) hypertension: Secondary | ICD-10-CM | POA: Diagnosis not present

## 2022-02-20 DIAGNOSIS — E538 Deficiency of other specified B group vitamins: Secondary | ICD-10-CM | POA: Diagnosis not present

## 2022-02-20 DIAGNOSIS — M159 Polyosteoarthritis, unspecified: Secondary | ICD-10-CM | POA: Diagnosis not present

## 2022-02-20 DIAGNOSIS — M2550 Pain in unspecified joint: Secondary | ICD-10-CM | POA: Diagnosis not present

## 2022-02-20 DIAGNOSIS — M797 Fibromyalgia: Secondary | ICD-10-CM | POA: Diagnosis not present

## 2022-02-21 DIAGNOSIS — G4733 Obstructive sleep apnea (adult) (pediatric): Secondary | ICD-10-CM | POA: Diagnosis not present

## 2022-02-25 ENCOUNTER — Encounter (INDEPENDENT_AMBULATORY_CARE_PROVIDER_SITE_OTHER): Payer: Self-pay

## 2022-02-26 ENCOUNTER — Other Ambulatory Visit: Payer: Self-pay | Admitting: Adult Health Nurse Practitioner

## 2022-02-26 DIAGNOSIS — Z1231 Encounter for screening mammogram for malignant neoplasm of breast: Secondary | ICD-10-CM

## 2022-02-26 DIAGNOSIS — F330 Major depressive disorder, recurrent, mild: Secondary | ICD-10-CM | POA: Diagnosis not present

## 2022-02-27 DIAGNOSIS — G5601 Carpal tunnel syndrome, right upper limb: Secondary | ICD-10-CM | POA: Diagnosis not present

## 2022-02-27 DIAGNOSIS — M79641 Pain in right hand: Secondary | ICD-10-CM | POA: Diagnosis not present

## 2022-02-27 DIAGNOSIS — G5603 Carpal tunnel syndrome, bilateral upper limbs: Secondary | ICD-10-CM | POA: Diagnosis not present

## 2022-02-27 DIAGNOSIS — G5602 Carpal tunnel syndrome, left upper limb: Secondary | ICD-10-CM | POA: Diagnosis not present

## 2022-03-05 DIAGNOSIS — F330 Major depressive disorder, recurrent, mild: Secondary | ICD-10-CM | POA: Diagnosis not present

## 2022-03-06 DIAGNOSIS — G5603 Carpal tunnel syndrome, bilateral upper limbs: Secondary | ICD-10-CM | POA: Diagnosis not present

## 2022-03-11 DIAGNOSIS — F330 Major depressive disorder, recurrent, mild: Secondary | ICD-10-CM | POA: Diagnosis not present

## 2022-03-13 DIAGNOSIS — M65342 Trigger finger, left ring finger: Secondary | ICD-10-CM | POA: Diagnosis not present

## 2022-03-13 DIAGNOSIS — Z6824 Body mass index (BMI) 24.0-24.9, adult: Secondary | ICD-10-CM | POA: Diagnosis not present

## 2022-03-13 DIAGNOSIS — G894 Chronic pain syndrome: Secondary | ICD-10-CM | POA: Diagnosis not present

## 2022-03-13 DIAGNOSIS — M129 Arthropathy, unspecified: Secondary | ICD-10-CM | POA: Diagnosis not present

## 2022-03-13 DIAGNOSIS — M65341 Trigger finger, right ring finger: Secondary | ICD-10-CM | POA: Diagnosis not present

## 2022-03-13 DIAGNOSIS — F1290 Cannabis use, unspecified, uncomplicated: Secondary | ICD-10-CM | POA: Diagnosis not present

## 2022-03-13 DIAGNOSIS — E559 Vitamin D deficiency, unspecified: Secondary | ICD-10-CM | POA: Diagnosis not present

## 2022-03-13 DIAGNOSIS — Z79899 Other long term (current) drug therapy: Secondary | ICD-10-CM | POA: Diagnosis not present

## 2022-03-13 DIAGNOSIS — G5601 Carpal tunnel syndrome, right upper limb: Secondary | ICD-10-CM | POA: Diagnosis not present

## 2022-03-13 DIAGNOSIS — M797 Fibromyalgia: Secondary | ICD-10-CM | POA: Diagnosis not present

## 2022-03-19 DIAGNOSIS — F330 Major depressive disorder, recurrent, mild: Secondary | ICD-10-CM | POA: Diagnosis not present

## 2022-03-24 DIAGNOSIS — G4733 Obstructive sleep apnea (adult) (pediatric): Secondary | ICD-10-CM | POA: Diagnosis not present

## 2022-03-26 DIAGNOSIS — F330 Major depressive disorder, recurrent, mild: Secondary | ICD-10-CM | POA: Diagnosis not present

## 2022-03-28 DIAGNOSIS — I10 Essential (primary) hypertension: Secondary | ICD-10-CM | POA: Diagnosis not present

## 2022-03-28 DIAGNOSIS — M47812 Spondylosis without myelopathy or radiculopathy, cervical region: Secondary | ICD-10-CM | POA: Diagnosis not present

## 2022-03-28 DIAGNOSIS — L659 Nonscarring hair loss, unspecified: Secondary | ICD-10-CM | POA: Diagnosis not present

## 2022-03-28 DIAGNOSIS — M25551 Pain in right hip: Secondary | ICD-10-CM | POA: Diagnosis not present

## 2022-03-28 DIAGNOSIS — M797 Fibromyalgia: Secondary | ICD-10-CM | POA: Diagnosis not present

## 2022-03-28 DIAGNOSIS — R7309 Other abnormal glucose: Secondary | ICD-10-CM | POA: Diagnosis not present

## 2022-03-28 DIAGNOSIS — Z789 Other specified health status: Secondary | ICD-10-CM | POA: Diagnosis not present

## 2022-04-02 DIAGNOSIS — F330 Major depressive disorder, recurrent, mild: Secondary | ICD-10-CM | POA: Diagnosis not present

## 2022-04-05 DIAGNOSIS — M79604 Pain in right leg: Secondary | ICD-10-CM | POA: Diagnosis not present

## 2022-04-05 DIAGNOSIS — M549 Dorsalgia, unspecified: Secondary | ICD-10-CM | POA: Diagnosis not present

## 2022-04-05 DIAGNOSIS — R531 Weakness: Secondary | ICD-10-CM | POA: Diagnosis not present

## 2022-04-05 DIAGNOSIS — M25551 Pain in right hip: Secondary | ICD-10-CM | POA: Diagnosis not present

## 2022-04-09 DIAGNOSIS — F330 Major depressive disorder, recurrent, mild: Secondary | ICD-10-CM | POA: Diagnosis not present

## 2022-04-15 DIAGNOSIS — L905 Scar conditions and fibrosis of skin: Secondary | ICD-10-CM | POA: Diagnosis not present

## 2022-04-15 DIAGNOSIS — L639 Alopecia areata, unspecified: Secondary | ICD-10-CM | POA: Diagnosis not present

## 2022-04-16 ENCOUNTER — Ambulatory Visit
Admission: RE | Admit: 2022-04-16 | Discharge: 2022-04-16 | Disposition: A | Discharge status: Home / Self Care | Payer: BC Managed Care – PPO | Source: Ambulatory Visit | Attending: Adult Health Nurse Practitioner | Admitting: Adult Health Nurse Practitioner

## 2022-04-16 DIAGNOSIS — R531 Weakness: Secondary | ICD-10-CM | POA: Diagnosis not present

## 2022-04-16 DIAGNOSIS — M25551 Pain in right hip: Secondary | ICD-10-CM | POA: Diagnosis not present

## 2022-04-16 DIAGNOSIS — Z1231 Encounter for screening mammogram for malignant neoplasm of breast: Secondary | ICD-10-CM | POA: Diagnosis not present

## 2022-04-16 DIAGNOSIS — M79604 Pain in right leg: Secondary | ICD-10-CM | POA: Diagnosis not present

## 2022-04-16 DIAGNOSIS — F330 Major depressive disorder, recurrent, mild: Secondary | ICD-10-CM | POA: Diagnosis not present

## 2022-04-16 DIAGNOSIS — M549 Dorsalgia, unspecified: Secondary | ICD-10-CM | POA: Diagnosis not present

## 2022-04-22 DIAGNOSIS — G4733 Obstructive sleep apnea (adult) (pediatric): Secondary | ICD-10-CM | POA: Diagnosis not present

## 2022-05-02 DIAGNOSIS — F330 Major depressive disorder, recurrent, mild: Secondary | ICD-10-CM | POA: Diagnosis not present

## 2022-05-07 DIAGNOSIS — F330 Major depressive disorder, recurrent, mild: Secondary | ICD-10-CM | POA: Diagnosis not present

## 2022-05-16 DIAGNOSIS — F330 Major depressive disorder, recurrent, mild: Secondary | ICD-10-CM | POA: Diagnosis not present

## 2022-05-21 DIAGNOSIS — F330 Major depressive disorder, recurrent, mild: Secondary | ICD-10-CM | POA: Diagnosis not present

## 2022-05-28 DIAGNOSIS — F330 Major depressive disorder, recurrent, mild: Secondary | ICD-10-CM | POA: Diagnosis not present

## 2022-05-30 DIAGNOSIS — M4319 Spondylolisthesis, multiple sites in spine: Secondary | ICD-10-CM | POA: Diagnosis not present

## 2022-05-30 DIAGNOSIS — M419 Scoliosis, unspecified: Secondary | ICD-10-CM | POA: Diagnosis not present

## 2022-05-30 DIAGNOSIS — M545 Low back pain, unspecified: Secondary | ICD-10-CM | POA: Diagnosis not present

## 2022-05-30 DIAGNOSIS — M810 Age-related osteoporosis without current pathological fracture: Secondary | ICD-10-CM | POA: Diagnosis not present

## 2022-06-03 DIAGNOSIS — F330 Major depressive disorder, recurrent, mild: Secondary | ICD-10-CM | POA: Diagnosis not present

## 2022-06-07 DIAGNOSIS — M5489 Other dorsalgia: Secondary | ICD-10-CM | POA: Diagnosis not present

## 2022-06-10 DIAGNOSIS — F330 Major depressive disorder, recurrent, mild: Secondary | ICD-10-CM | POA: Diagnosis not present

## 2022-06-10 DIAGNOSIS — M2569 Stiffness of other specified joint, not elsewhere classified: Secondary | ICD-10-CM | POA: Diagnosis not present

## 2022-06-10 DIAGNOSIS — M25551 Pain in right hip: Secondary | ICD-10-CM | POA: Diagnosis not present

## 2022-06-10 DIAGNOSIS — M5489 Other dorsalgia: Secondary | ICD-10-CM | POA: Diagnosis not present

## 2022-06-10 DIAGNOSIS — M6281 Muscle weakness (generalized): Secondary | ICD-10-CM | POA: Diagnosis not present

## 2022-06-12 DIAGNOSIS — G4733 Obstructive sleep apnea (adult) (pediatric): Secondary | ICD-10-CM | POA: Diagnosis not present

## 2022-06-12 DIAGNOSIS — I10 Essential (primary) hypertension: Secondary | ICD-10-CM | POA: Diagnosis not present

## 2022-06-17 DIAGNOSIS — F330 Major depressive disorder, recurrent, mild: Secondary | ICD-10-CM | POA: Diagnosis not present

## 2022-06-18 DIAGNOSIS — M6281 Muscle weakness (generalized): Secondary | ICD-10-CM | POA: Diagnosis not present

## 2022-06-18 DIAGNOSIS — M2569 Stiffness of other specified joint, not elsewhere classified: Secondary | ICD-10-CM | POA: Diagnosis not present

## 2022-06-18 DIAGNOSIS — M25551 Pain in right hip: Secondary | ICD-10-CM | POA: Diagnosis not present

## 2022-06-18 DIAGNOSIS — M5489 Other dorsalgia: Secondary | ICD-10-CM | POA: Diagnosis not present

## 2022-06-20 DIAGNOSIS — M545 Low back pain, unspecified: Secondary | ICD-10-CM | POA: Diagnosis not present

## 2022-06-24 ENCOUNTER — Other Ambulatory Visit: Payer: Self-pay | Admitting: Physician Assistant

## 2022-06-24 ENCOUNTER — Encounter: Payer: Self-pay | Admitting: Physician Assistant

## 2022-06-24 DIAGNOSIS — M4319 Spondylolisthesis, multiple sites in spine: Secondary | ICD-10-CM

## 2022-06-24 DIAGNOSIS — M545 Low back pain, unspecified: Secondary | ICD-10-CM

## 2022-06-24 DIAGNOSIS — M419 Scoliosis, unspecified: Secondary | ICD-10-CM

## 2022-06-24 DIAGNOSIS — F330 Major depressive disorder, recurrent, mild: Secondary | ICD-10-CM | POA: Diagnosis not present

## 2022-07-01 DIAGNOSIS — F330 Major depressive disorder, recurrent, mild: Secondary | ICD-10-CM | POA: Diagnosis not present

## 2022-07-12 ENCOUNTER — Encounter: Payer: Self-pay | Admitting: Physician Assistant

## 2022-07-14 ENCOUNTER — Ambulatory Visit
Admission: RE | Admit: 2022-07-14 | Discharge: 2022-07-14 | Disposition: A | Discharge status: Home / Self Care | Payer: BC Managed Care – PPO | Source: Ambulatory Visit | Attending: Physician Assistant | Admitting: Physician Assistant

## 2022-07-14 DIAGNOSIS — M4319 Spondylolisthesis, multiple sites in spine: Secondary | ICD-10-CM

## 2022-07-14 DIAGNOSIS — M545 Low back pain, unspecified: Secondary | ICD-10-CM

## 2022-07-14 DIAGNOSIS — M4726 Other spondylosis with radiculopathy, lumbar region: Secondary | ICD-10-CM | POA: Diagnosis not present

## 2022-07-14 DIAGNOSIS — M419 Scoliosis, unspecified: Secondary | ICD-10-CM

## 2022-07-15 DIAGNOSIS — F330 Major depressive disorder, recurrent, mild: Secondary | ICD-10-CM | POA: Diagnosis not present

## 2022-07-17 DIAGNOSIS — M65342 Trigger finger, left ring finger: Secondary | ICD-10-CM | POA: Diagnosis not present

## 2022-07-17 DIAGNOSIS — G5601 Carpal tunnel syndrome, right upper limb: Secondary | ICD-10-CM | POA: Diagnosis not present

## 2022-07-18 DIAGNOSIS — M545 Low back pain, unspecified: Secondary | ICD-10-CM | POA: Diagnosis not present

## 2022-07-22 DIAGNOSIS — M810 Age-related osteoporosis without current pathological fracture: Secondary | ICD-10-CM | POA: Diagnosis not present

## 2022-07-22 DIAGNOSIS — J4520 Mild intermittent asthma, uncomplicated: Secondary | ICD-10-CM | POA: Diagnosis not present

## 2022-07-22 DIAGNOSIS — G5603 Carpal tunnel syndrome, bilateral upper limbs: Secondary | ICD-10-CM | POA: Diagnosis not present

## 2022-07-22 DIAGNOSIS — I10 Essential (primary) hypertension: Secondary | ICD-10-CM | POA: Diagnosis not present

## 2022-07-23 DIAGNOSIS — F330 Major depressive disorder, recurrent, mild: Secondary | ICD-10-CM | POA: Diagnosis not present

## 2022-07-25 DIAGNOSIS — G5601 Carpal tunnel syndrome, right upper limb: Secondary | ICD-10-CM | POA: Diagnosis not present

## 2022-07-29 DIAGNOSIS — F330 Major depressive disorder, recurrent, mild: Secondary | ICD-10-CM | POA: Diagnosis not present

## 2022-08-05 DIAGNOSIS — G5601 Carpal tunnel syndrome, right upper limb: Secondary | ICD-10-CM | POA: Diagnosis not present

## 2022-08-06 DIAGNOSIS — R002 Palpitations: Secondary | ICD-10-CM | POA: Diagnosis not present

## 2022-08-06 DIAGNOSIS — I10 Essential (primary) hypertension: Secondary | ICD-10-CM | POA: Diagnosis not present

## 2022-08-06 DIAGNOSIS — R011 Cardiac murmur, unspecified: Secondary | ICD-10-CM | POA: Diagnosis not present

## 2022-08-08 DIAGNOSIS — F330 Major depressive disorder, recurrent, mild: Secondary | ICD-10-CM | POA: Diagnosis not present

## 2022-08-12 DIAGNOSIS — F330 Major depressive disorder, recurrent, mild: Secondary | ICD-10-CM | POA: Diagnosis not present

## 2022-08-19 DIAGNOSIS — F330 Major depressive disorder, recurrent, mild: Secondary | ICD-10-CM | POA: Diagnosis not present

## 2022-08-27 DIAGNOSIS — F330 Major depressive disorder, recurrent, mild: Secondary | ICD-10-CM | POA: Diagnosis not present

## 2022-08-30 DIAGNOSIS — G4733 Obstructive sleep apnea (adult) (pediatric): Secondary | ICD-10-CM | POA: Diagnosis not present

## 2022-09-09 DIAGNOSIS — F330 Major depressive disorder, recurrent, mild: Secondary | ICD-10-CM | POA: Diagnosis not present

## 2022-09-11 DIAGNOSIS — J4520 Mild intermittent asthma, uncomplicated: Secondary | ICD-10-CM | POA: Diagnosis not present

## 2022-09-11 DIAGNOSIS — H6121 Impacted cerumen, right ear: Secondary | ICD-10-CM | POA: Diagnosis not present

## 2022-09-11 DIAGNOSIS — H9201 Otalgia, right ear: Secondary | ICD-10-CM | POA: Diagnosis not present

## 2022-09-12 DIAGNOSIS — F331 Major depressive disorder, recurrent, moderate: Secondary | ICD-10-CM | POA: Diagnosis not present

## 2022-09-14 DIAGNOSIS — J019 Acute sinusitis, unspecified: Secondary | ICD-10-CM | POA: Diagnosis not present

## 2022-09-17 DIAGNOSIS — M48062 Spinal stenosis, lumbar region with neurogenic claudication: Secondary | ICD-10-CM | POA: Diagnosis not present

## 2022-09-27 DIAGNOSIS — F331 Major depressive disorder, recurrent, moderate: Secondary | ICD-10-CM | POA: Diagnosis not present

## 2022-09-30 DIAGNOSIS — G4733 Obstructive sleep apnea (adult) (pediatric): Secondary | ICD-10-CM | POA: Diagnosis not present

## 2022-10-07 DIAGNOSIS — F331 Major depressive disorder, recurrent, moderate: Secondary | ICD-10-CM | POA: Diagnosis not present

## 2022-10-07 DIAGNOSIS — M5416 Radiculopathy, lumbar region: Secondary | ICD-10-CM | POA: Diagnosis not present

## 2022-10-08 DIAGNOSIS — Z124 Encounter for screening for malignant neoplasm of cervix: Secondary | ICD-10-CM | POA: Diagnosis not present

## 2022-10-08 DIAGNOSIS — Z01419 Encounter for gynecological examination (general) (routine) without abnormal findings: Secondary | ICD-10-CM | POA: Diagnosis not present

## 2022-10-08 DIAGNOSIS — Z6823 Body mass index (BMI) 23.0-23.9, adult: Secondary | ICD-10-CM | POA: Diagnosis not present

## 2022-10-08 DIAGNOSIS — Z1151 Encounter for screening for human papillomavirus (HPV): Secondary | ICD-10-CM | POA: Diagnosis not present

## 2022-10-08 DIAGNOSIS — N898 Other specified noninflammatory disorders of vagina: Secondary | ICD-10-CM | POA: Diagnosis not present

## 2022-10-08 DIAGNOSIS — Z113 Encounter for screening for infections with a predominantly sexual mode of transmission: Secondary | ICD-10-CM | POA: Diagnosis not present

## 2022-10-11 DIAGNOSIS — Z131 Encounter for screening for diabetes mellitus: Secondary | ICD-10-CM | POA: Diagnosis not present

## 2022-10-11 DIAGNOSIS — R634 Abnormal weight loss: Secondary | ICD-10-CM | POA: Diagnosis not present

## 2022-10-11 DIAGNOSIS — R450 Nervousness: Secondary | ICD-10-CM | POA: Diagnosis not present

## 2022-10-16 DIAGNOSIS — F331 Major depressive disorder, recurrent, moderate: Secondary | ICD-10-CM | POA: Diagnosis not present

## 2022-10-16 DIAGNOSIS — G4733 Obstructive sleep apnea (adult) (pediatric): Secondary | ICD-10-CM | POA: Diagnosis not present

## 2022-10-18 DIAGNOSIS — F3342 Major depressive disorder, recurrent, in full remission: Secondary | ICD-10-CM | POA: Diagnosis not present

## 2022-10-29 DIAGNOSIS — M5416 Radiculopathy, lumbar region: Secondary | ICD-10-CM | POA: Diagnosis not present

## 2022-10-31 DIAGNOSIS — G4733 Obstructive sleep apnea (adult) (pediatric): Secondary | ICD-10-CM | POA: Diagnosis not present

## 2022-11-14 DIAGNOSIS — G4733 Obstructive sleep apnea (adult) (pediatric): Secondary | ICD-10-CM | POA: Diagnosis not present

## 2022-11-22 DIAGNOSIS — Z79899 Other long term (current) drug therapy: Secondary | ICD-10-CM | POA: Diagnosis not present

## 2022-11-22 DIAGNOSIS — Z87891 Personal history of nicotine dependence: Secondary | ICD-10-CM | POA: Diagnosis not present

## 2022-11-22 DIAGNOSIS — M810 Age-related osteoporosis without current pathological fracture: Secondary | ICD-10-CM | POA: Diagnosis not present

## 2022-11-22 DIAGNOSIS — Z8781 Personal history of (healed) traumatic fracture: Secondary | ICD-10-CM | POA: Diagnosis not present

## 2022-12-04 DIAGNOSIS — Z8709 Personal history of other diseases of the respiratory system: Secondary | ICD-10-CM | POA: Diagnosis not present

## 2022-12-04 DIAGNOSIS — J019 Acute sinusitis, unspecified: Secondary | ICD-10-CM | POA: Diagnosis not present

## 2022-12-04 DIAGNOSIS — J209 Acute bronchitis, unspecified: Secondary | ICD-10-CM | POA: Diagnosis not present

## 2022-12-04 DIAGNOSIS — R058 Other specified cough: Secondary | ICD-10-CM | POA: Diagnosis not present

## 2022-12-05 DIAGNOSIS — G4733 Obstructive sleep apnea (adult) (pediatric): Secondary | ICD-10-CM | POA: Diagnosis not present

## 2023-01-05 DIAGNOSIS — G4733 Obstructive sleep apnea (adult) (pediatric): Secondary | ICD-10-CM | POA: Diagnosis not present

## 2023-02-04 DIAGNOSIS — G4733 Obstructive sleep apnea (adult) (pediatric): Secondary | ICD-10-CM | POA: Diagnosis not present

## 2023-02-18 DIAGNOSIS — G4733 Obstructive sleep apnea (adult) (pediatric): Secondary | ICD-10-CM | POA: Diagnosis not present

## 2023-03-10 DIAGNOSIS — I10 Essential (primary) hypertension: Secondary | ICD-10-CM | POA: Diagnosis not present

## 2023-03-10 DIAGNOSIS — M810 Age-related osteoporosis without current pathological fracture: Secondary | ICD-10-CM | POA: Diagnosis not present

## 2023-03-10 DIAGNOSIS — M797 Fibromyalgia: Secondary | ICD-10-CM | POA: Diagnosis not present

## 2023-03-10 DIAGNOSIS — M479 Spondylosis, unspecified: Secondary | ICD-10-CM | POA: Diagnosis not present

## 2023-03-18 DIAGNOSIS — G4733 Obstructive sleep apnea (adult) (pediatric): Secondary | ICD-10-CM | POA: Diagnosis not present

## 2023-04-08 ENCOUNTER — Other Ambulatory Visit: Payer: Self-pay | Admitting: Obstetrics and Gynecology

## 2023-04-08 DIAGNOSIS — Z1231 Encounter for screening mammogram for malignant neoplasm of breast: Secondary | ICD-10-CM

## 2023-04-12 DIAGNOSIS — S161XXA Strain of muscle, fascia and tendon at neck level, initial encounter: Secondary | ICD-10-CM | POA: Diagnosis not present

## 2023-04-15 DIAGNOSIS — G4733 Obstructive sleep apnea (adult) (pediatric): Secondary | ICD-10-CM | POA: Diagnosis not present

## 2023-04-16 DIAGNOSIS — M5412 Radiculopathy, cervical region: Secondary | ICD-10-CM | POA: Diagnosis not present

## 2023-04-17 ENCOUNTER — Ambulatory Visit: Payer: BC Managed Care – PPO

## 2023-04-23 ENCOUNTER — Ambulatory Visit
Admission: RE | Admit: 2023-04-23 | Discharge: 2023-04-23 | Disposition: A | Discharge status: Home / Self Care | Source: Ambulatory Visit | Attending: Obstetrics and Gynecology | Admitting: Obstetrics and Gynecology

## 2023-04-23 DIAGNOSIS — Z1231 Encounter for screening mammogram for malignant neoplasm of breast: Secondary | ICD-10-CM

## 2023-05-01 DIAGNOSIS — L718 Other rosacea: Secondary | ICD-10-CM | POA: Diagnosis not present

## 2023-05-10 DIAGNOSIS — M5412 Radiculopathy, cervical region: Secondary | ICD-10-CM | POA: Diagnosis not present

## 2023-05-16 DIAGNOSIS — G4733 Obstructive sleep apnea (adult) (pediatric): Secondary | ICD-10-CM | POA: Diagnosis not present

## 2023-05-19 DIAGNOSIS — M5030 Other cervical disc degeneration, unspecified cervical region: Secondary | ICD-10-CM | POA: Diagnosis not present

## 2023-05-19 DIAGNOSIS — M47812 Spondylosis without myelopathy or radiculopathy, cervical region: Secondary | ICD-10-CM | POA: Diagnosis not present

## 2023-06-09 DIAGNOSIS — M5030 Other cervical disc degeneration, unspecified cervical region: Secondary | ICD-10-CM | POA: Diagnosis not present

## 2023-06-09 DIAGNOSIS — M542 Cervicalgia: Secondary | ICD-10-CM | POA: Diagnosis not present

## 2023-06-18 DIAGNOSIS — M542 Cervicalgia: Secondary | ICD-10-CM | POA: Diagnosis not present

## 2023-06-25 DIAGNOSIS — M542 Cervicalgia: Secondary | ICD-10-CM | POA: Diagnosis not present

## 2023-06-25 DIAGNOSIS — G4733 Obstructive sleep apnea (adult) (pediatric): Secondary | ICD-10-CM | POA: Diagnosis not present

## 2023-07-24 DIAGNOSIS — M542 Cervicalgia: Secondary | ICD-10-CM | POA: Diagnosis not present

## 2023-08-13 DIAGNOSIS — Z1330 Encounter for screening examination for mental health and behavioral disorders, unspecified: Secondary | ICD-10-CM | POA: Diagnosis not present

## 2023-08-13 DIAGNOSIS — R002 Palpitations: Secondary | ICD-10-CM | POA: Diagnosis not present

## 2023-08-13 DIAGNOSIS — R011 Cardiac murmur, unspecified: Secondary | ICD-10-CM | POA: Diagnosis not present

## 2023-08-13 DIAGNOSIS — I10 Essential (primary) hypertension: Secondary | ICD-10-CM | POA: Diagnosis not present

## 2023-08-20 DIAGNOSIS — M542 Cervicalgia: Secondary | ICD-10-CM | POA: Diagnosis not present

## 2023-08-20 DIAGNOSIS — M5030 Other cervical disc degeneration, unspecified cervical region: Secondary | ICD-10-CM | POA: Diagnosis not present

## 2023-08-26 DIAGNOSIS — G4733 Obstructive sleep apnea (adult) (pediatric): Secondary | ICD-10-CM | POA: Diagnosis not present

## 2023-09-10 DIAGNOSIS — L501 Idiopathic urticaria: Secondary | ICD-10-CM | POA: Diagnosis not present

## 2023-09-10 DIAGNOSIS — L728 Other follicular cysts of the skin and subcutaneous tissue: Secondary | ICD-10-CM | POA: Diagnosis not present

## 2023-09-11 DIAGNOSIS — M542 Cervicalgia: Secondary | ICD-10-CM | POA: Diagnosis not present

## 2023-09-15 DIAGNOSIS — F329 Major depressive disorder, single episode, unspecified: Secondary | ICD-10-CM | POA: Diagnosis not present

## 2023-09-15 DIAGNOSIS — M542 Cervicalgia: Secondary | ICD-10-CM | POA: Diagnosis not present

## 2023-09-15 DIAGNOSIS — M5030 Other cervical disc degeneration, unspecified cervical region: Secondary | ICD-10-CM | POA: Diagnosis not present

## 2023-09-15 DIAGNOSIS — M810 Age-related osteoporosis without current pathological fracture: Secondary | ICD-10-CM | POA: Diagnosis not present

## 2023-09-15 DIAGNOSIS — I10 Essential (primary) hypertension: Secondary | ICD-10-CM | POA: Diagnosis not present

## 2023-09-15 DIAGNOSIS — Z0000 Encounter for general adult medical examination without abnormal findings: Secondary | ICD-10-CM | POA: Diagnosis not present

## 2023-09-15 DIAGNOSIS — J4520 Mild intermittent asthma, uncomplicated: Secondary | ICD-10-CM | POA: Diagnosis not present

## 2023-09-16 DIAGNOSIS — M542 Cervicalgia: Secondary | ICD-10-CM | POA: Diagnosis not present

## 2023-09-17 ENCOUNTER — Other Ambulatory Visit (HOSPITAL_BASED_OUTPATIENT_CLINIC_OR_DEPARTMENT_OTHER): Payer: Self-pay | Admitting: Physician Assistant

## 2023-09-17 DIAGNOSIS — M5030 Other cervical disc degeneration, unspecified cervical region: Secondary | ICD-10-CM | POA: Diagnosis not present

## 2023-09-17 DIAGNOSIS — M542 Cervicalgia: Secondary | ICD-10-CM | POA: Diagnosis not present

## 2023-09-17 DIAGNOSIS — M810 Age-related osteoporosis without current pathological fracture: Secondary | ICD-10-CM

## 2023-09-22 DIAGNOSIS — M5030 Other cervical disc degeneration, unspecified cervical region: Secondary | ICD-10-CM | POA: Diagnosis not present

## 2023-09-22 DIAGNOSIS — M542 Cervicalgia: Secondary | ICD-10-CM | POA: Diagnosis not present

## 2023-09-25 DIAGNOSIS — G4733 Obstructive sleep apnea (adult) (pediatric): Secondary | ICD-10-CM | POA: Diagnosis not present

## 2023-10-14 DIAGNOSIS — F3342 Major depressive disorder, recurrent, in full remission: Secondary | ICD-10-CM | POA: Diagnosis not present

## 2023-10-14 DIAGNOSIS — M797 Fibromyalgia: Secondary | ICD-10-CM | POA: Diagnosis not present

## 2023-11-03 DIAGNOSIS — Z01419 Encounter for gynecological examination (general) (routine) without abnormal findings: Secondary | ICD-10-CM | POA: Diagnosis not present

## 2023-11-03 DIAGNOSIS — Z6823 Body mass index (BMI) 23.0-23.9, adult: Secondary | ICD-10-CM | POA: Diagnosis not present

## 2023-11-20 DIAGNOSIS — M5030 Other cervical disc degeneration, unspecified cervical region: Secondary | ICD-10-CM | POA: Diagnosis not present

## 2023-11-20 DIAGNOSIS — M5412 Radiculopathy, cervical region: Secondary | ICD-10-CM | POA: Diagnosis not present

## 2023-12-23 DIAGNOSIS — Z8639 Personal history of other endocrine, nutritional and metabolic disease: Secondary | ICD-10-CM | POA: Diagnosis not present

## 2023-12-23 DIAGNOSIS — K219 Gastro-esophageal reflux disease without esophagitis: Secondary | ICD-10-CM | POA: Diagnosis not present

## 2023-12-23 DIAGNOSIS — M810 Age-related osteoporosis without current pathological fracture: Secondary | ICD-10-CM | POA: Diagnosis not present

## 2023-12-23 DIAGNOSIS — M797 Fibromyalgia: Secondary | ICD-10-CM | POA: Diagnosis not present

## 2023-12-31 DIAGNOSIS — Z7983 Long term (current) use of bisphosphonates: Secondary | ICD-10-CM | POA: Diagnosis not present

## 2023-12-31 DIAGNOSIS — Z5181 Encounter for therapeutic drug level monitoring: Secondary | ICD-10-CM | POA: Diagnosis not present

## 2023-12-31 DIAGNOSIS — Z8639 Personal history of other endocrine, nutritional and metabolic disease: Secondary | ICD-10-CM | POA: Diagnosis not present

## 2023-12-31 DIAGNOSIS — M810 Age-related osteoporosis without current pathological fracture: Secondary | ICD-10-CM | POA: Diagnosis not present

## 2024-01-21 DIAGNOSIS — L814 Other melanin hyperpigmentation: Secondary | ICD-10-CM | POA: Diagnosis not present

## 2024-01-21 DIAGNOSIS — L820 Inflamed seborrheic keratosis: Secondary | ICD-10-CM | POA: Diagnosis not present

## 2024-01-21 DIAGNOSIS — K130 Diseases of lips: Secondary | ICD-10-CM | POA: Diagnosis not present

## 2024-01-21 DIAGNOSIS — L728 Other follicular cysts of the skin and subcutaneous tissue: Secondary | ICD-10-CM | POA: Diagnosis not present

## 2024-01-21 DIAGNOSIS — L538 Other specified erythematous conditions: Secondary | ICD-10-CM | POA: Diagnosis not present

## 2024-01-21 DIAGNOSIS — D485 Neoplasm of uncertain behavior of skin: Secondary | ICD-10-CM | POA: Diagnosis not present

## 2024-01-21 DIAGNOSIS — D1801 Hemangioma of skin and subcutaneous tissue: Secondary | ICD-10-CM | POA: Diagnosis not present
